# Patient Record
Sex: Female | Born: 1960 | Hispanic: Yes | Marital: Married | Smoking: Never smoker
Health system: Southern US, Community
[De-identification: ages and names within clinical notes are randomized; demographics above are authoritative.]

## PROBLEM LIST (undated history)

## (undated) DIAGNOSIS — D649 Anemia, unspecified: Secondary | ICD-10-CM

## (undated) DIAGNOSIS — G4730 Sleep apnea, unspecified: Secondary | ICD-10-CM

## (undated) DIAGNOSIS — F419 Anxiety disorder, unspecified: Secondary | ICD-10-CM

## (undated) HISTORY — PX: OTHER SURGICAL HISTORY: SHX169

## (undated) HISTORY — DX: Anxiety disorder, unspecified: F41.9

## (undated) HISTORY — DX: Anemia, unspecified: D64.9

## (undated) HISTORY — PX: BREAST REDUCTION SURGERY: SHX8

## (undated) HISTORY — DX: Sleep apnea, unspecified: G47.30

## (undated) HISTORY — PX: BARIATRIC SURGERY: SHX1103

## (undated) HISTORY — PX: COSMETIC SURGERY: SHX468

---

## 1990-12-15 DIAGNOSIS — D509 Iron deficiency anemia, unspecified: Secondary | ICD-10-CM | POA: Insufficient documentation

## 2006-04-29 HISTORY — PX: BARIATRIC SURGERY: SHX1103

## 2010-05-07 LAB — HM DEXA SCAN

## 2014-02-04 LAB — HEPATIC FUNCTION PANEL
ALT: 16 U/L (ref 7–35)
AST: 18 U/L (ref 13–35)
Alkaline Phosphatase: 114 U/L (ref 25–125)
Bilirubin, Total: 0.3 mg/dL

## 2014-02-04 LAB — LIPID PANEL
Cholesterol: 200 mg/dL (ref 0–200)
HDL: 88 mg/dL — AB (ref 35–70)
LDL Cholesterol: 95 mg/dL
TRIGLYCERIDES: 87 mg/dL (ref 40–160)

## 2014-02-04 LAB — BASIC METABOLIC PANEL
BUN: 22 mg/dL — AB (ref 4–21)
Creatinine: 0.8 mg/dL (ref <=1.1)
Glucose: 93 mg/dL
POTASSIUM: 3.8 mmol/L (ref 3.4–5.3)
Sodium: 139 mmol/L (ref 137–147)

## 2014-02-04 LAB — HEMOGLOBIN A1C

## 2014-02-04 LAB — CBC AND DIFFERENTIAL
HCT: 37 % (ref 36–46)
HEMOGLOBIN: 11.8 g/dL — AB (ref 12.0–16.0)
PLATELETS: 215 10*3/uL (ref 150–399)
WBC: 6.9 10*3/mL

## 2015-09-21 ENCOUNTER — Ambulatory Visit (INDEPENDENT_AMBULATORY_CARE_PROVIDER_SITE_OTHER): Payer: 59 | Admitting: Family Medicine

## 2015-09-21 ENCOUNTER — Encounter: Payer: Self-pay | Admitting: Family Medicine

## 2015-09-21 VITALS — BP 111/74 | HR 72 | Ht 65.0 in | Wt 242.0 lb

## 2015-09-21 DIAGNOSIS — F411 Generalized anxiety disorder: Secondary | ICD-10-CM

## 2015-09-21 DIAGNOSIS — M609 Myositis, unspecified: Secondary | ICD-10-CM

## 2015-09-21 DIAGNOSIS — E669 Obesity, unspecified: Secondary | ICD-10-CM

## 2015-09-21 DIAGNOSIS — G43009 Migraine without aura, not intractable, without status migrainosus: Secondary | ICD-10-CM

## 2015-09-21 DIAGNOSIS — F5102 Adjustment insomnia: Secondary | ICD-10-CM

## 2015-09-21 DIAGNOSIS — IMO0001 Reserved for inherently not codable concepts without codable children: Secondary | ICD-10-CM | POA: Insufficient documentation

## 2015-09-21 DIAGNOSIS — M791 Myalgia: Secondary | ICD-10-CM | POA: Diagnosis not present

## 2015-09-21 DIAGNOSIS — Z1239 Encounter for other screening for malignant neoplasm of breast: Secondary | ICD-10-CM

## 2015-09-21 DIAGNOSIS — G43909 Migraine, unspecified, not intractable, without status migrainosus: Secondary | ICD-10-CM | POA: Insufficient documentation

## 2015-09-21 DIAGNOSIS — F419 Anxiety disorder, unspecified: Secondary | ICD-10-CM | POA: Insufficient documentation

## 2015-09-21 MED ORDER — ZOLPIDEM TARTRATE ER 6.25 MG PO TBCR: 6.2500 mg | EXTENDED_RELEASE_TABLET | Freq: Every evening | ORAL | Status: DC | PRN

## 2015-09-21 MED ORDER — NABUMETONE 500 MG PO TABS: 500.0000 mg | ORAL_TABLET | Freq: Once | ORAL | Status: DC

## 2015-09-21 MED ORDER — TOPIRAMATE 25 MG PO CPSP: 25.0000 mg | ORAL_CAPSULE | Freq: Two times a day (BID) | ORAL | Status: DC

## 2015-09-21 MED ORDER — BUSPIRONE HCL 10 MG PO TABS: 10.0000 mg | ORAL_TABLET | Freq: Two times a day (BID) | ORAL | Status: DC

## 2015-09-21 MED ORDER — VENLAFAXINE HCL ER 150 MG PO CP24: 150.0000 mg | ORAL_CAPSULE | Freq: Once | ORAL | Status: DC

## 2015-09-21 MED ORDER — BUTALBITAL-APAP-CAFF-COD 50-325-40-30 MG PO CAPS: 1.0000 | ORAL_CAPSULE | ORAL | Status: DC | PRN

## 2015-09-21 NOTE — Assessment & Plan Note (Signed)
Refill BuSpar and Effexor. Routine counseling done

## 2015-09-21 NOTE — Patient Instructions (Signed)
1) keep migraine/headache journal 2) refill all current meds without change 3) Will await medical records from [place] neurologist and PCP. 4) refer for mammography last one done 2015, always been normal 5) given information about:: cologuard, please call your medical insurance to ask him much it would cost. 6) follow-up one to 2 months and hopefully by then we will have all medical records. We will reassess how her migraines are and review your migraine journal. At that time we will make medication changes as needed.   Migraine Headache A migraine headache is an intense, throbbing pain on one or both sides of your head. A migraine can last for 30 minutes to several hours. CAUSES  The exact cause of a migraine headache is not always known. However, a migraine may be caused when nerves in the brain become irritated and release chemicals that cause inflammation. This causes pain. Certain things may also trigger migraines, such as:  Alcohol.  Smoking.  Stress.  Menstruation.  Aged cheeses.  Foods or drinks that contain nitrates, glutamate, aspartame, or tyramine.  Lack of sleep.  Chocolate.  Caffeine.  Hunger.  Physical exertion.  Fatigue.  Medicines used to treat chest pain (nitroglycerine), birth control pills, estrogen, and some blood pressure medicines. SIGNS AND SYMPTOMS  Pain on one or both sides of your head.  Pulsating or throbbing pain.  Severe pain that prevents daily activities.  Pain that is aggravated by any physical activity.  Nausea, vomiting, or both.  Dizziness.  Pain with exposure to bright lights, loud noises, or activity.  General sensitivity to bright lights, loud noises, or smells. Before you get a migraine, you may get warning signs that a migraine is coming (aura). An aura may include:  Seeing flashing lights.  Seeing bright spots, halos, or zigzag lines.  Having tunnel vision or blurred vision.  Having feelings of numbness or  tingling.  Having trouble talking.  Having muscle weakness. DIAGNOSIS  A migraine headache is often diagnosed based on:  Symptoms.  Physical exam.  A CT scan or MRI of your head. These imaging tests cannot diagnose migraines, but they can help rule out other causes of headaches. TREATMENT Medicines may be given for pain and nausea. Medicines can also be given to help prevent recurrent migraines.  HOME CARE INSTRUCTIONS  Only take over-the-counter or prescription medicines for pain or discomfort as directed by your health care provider. The use of long-term narcotics is not recommended.  Lie down in a dark, quiet room when you have a migraine.  Keep a journal to find out what may trigger your migraine headaches. For example, write down:  What you eat and drink.  How much sleep you get.  Any change to your diet or medicines.  Limit alcohol consumption.  Quit smoking if you smoke.  Get 7-9 hours of sleep, or as recommended by your health care provider.  Limit stress.  Keep lights dim if bright lights bother you and make your migraines worse. SEEK IMMEDIATE MEDICAL CARE IF:   Your migraine becomes severe.  You have a fever.  You have a stiff neck.  You have vision loss.  You have muscular weakness or loss of muscle control.  You start losing your balance or have trouble walking.  You feel faint or pass out.  You have severe symptoms that are different from your first symptoms. MAKE SURE YOU:   Understand these instructions.  Will watch your condition.  Will get help right away if you are not  doing well or get worse.   This information is not intended to replace advice given to you by your health care provider. Make sure you discuss any questions you have with your health care provider.   Document Released: 04/15/2005 Document Revised: 05/06/2014 Document Reviewed: 12/21/2012 Elsevier Interactive Patient Education [employer].

## 2015-09-21 NOTE — Progress Notes (Signed)
Marjory Sneddon, D.O. Family Medicine Physician [facility] Location: Primary Care at [facility]     Subjective:    CC: New pt, here to establish care.   HPI: Margaret Schultz is a pleasant 55 y.o. female who presents to [place] at [facility] today To establish care. She moved here 3 months ago from [place] for her husband's job. She has not established with a PCP in the area. Previously she had been seen by PCP in [place] and a neurologist for her migraines. She has had multiple CAT scans of her head and carotid scans as well. She says they were all normal.  Migraine:  3-4 days/wk with headaches and 1- 2 periods per month of very "strong" migraines. She has no change in her migraines from her baseline. She states that the medicines work ok and she tolerates them well.  Her neurologist in [place] did talk to her about Botox injections but the patient did not want to undergo that procedure.  She tells me she been on Relpax in the past but it makes her throat feels like it's closing so it did not work well.  She is on the Relafen for upper back tightness and muscle spasms which she said is a contributor to her migraines.  The Fioricet is the only thing that will work on some days to break her migraines when they get to the moderate to intense level.  She does not know if there is any triggers to her migraines and states that she will only has one to 2 caffeinated beverages per day.  Obesity: She has had bariatric surgery in the past. She does not follow a healthy diet and admits to eating lots of bread products and white flour products, cheese and milk products. She does not exercise.  She admits that she needs to lose weight and refers to herself as "fat".    Health maintenance:  -Her last mammogram was in 2015 and it was normal, she has never had an abnormal mammogram -Colonoscopy: she has never had one because she feared the procedure. - Last Pap 2015, always  have been normal. Low risk as she's been married for over 30 years.   Patient lives at home with her husband Margaret Schultz) and has one 77 year old son who is in [place] up in [place] in M.D./ PhD program.  Her husband was a physician in [place] where they used to live and currently works in Nurse, children's at [employer]. 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 She has her masters degree in Art therapist.     Past Medical History  Diagnosis Date  . Migraine   . Anxiety     Past Surgical History  Procedure Laterality Date  . Bariatric surgery  2008  . Cosmetic surgery      2010    @[facility] @  History  Drug Use No  ,  History  Alcohol Use No  ,  History  Smoking status  . Never Smoker   Smokeless tobacco  . Never Used  ,  History  Sexual Activity  . Sexual Activity: Yes  . Birth Control/ Protection: None    Patient's Medications  New Prescriptions   No medications on file  Previous Medications   ACETAMINOPHEN-BUTALBITAL 50-325 MG TABS    Take 50-325 mg by mouth daily.   BUSPIRONE (BUSPAR) 10  MG TABLET    Take 10 mg by mouth 2 (two) times daily.   NABUMETONE (RELAFEN) 500 MG TABLET    Take 500 mg by mouth once.   TOPIRAMATE (TOPAMAX) 25 MG CAPSULE    Take 25 mg by mouth 2 (two) times daily.   VENLAFAXINE XR (EFFEXOR-XR) 150 MG 24 HR CAPSULE    Take 150 mg by mouth once.   ZOLPIDEM (AMBIEN CR) 6.25 MG CR TABLET    Take 6.25 mg by mouth at bedtime as needed.  Modified Medications   No medications on file  Discontinued Medications   No medications on file    Review of patient's allergies indicates no known allergies.   Review of Systems: Full 14 point ROS performed via "adult medical history form".  Negative except for Headaches, anxiety, sleep problem which are all chronic.     Objective:   Blood pressure 111/74, pulse 72, height 5\' 5"  (1.651 m), weight 242  lb (109.77 kg), SpO2 98 %.Body mass index is 40.27 kg/(m^2).   General: Well Developed, well nourished, and in no acute distress.  Neuro: Alert and oriented x3, extra-ocular muscles intact, sensation grossly intact.  HEENT: Normocephalic, atraumatic, pupils equal round reactive to light, neck supple, no gross masses Skin: no gross suspicious lesions or rashes  Cardiac: Regular rate and rhythm, no murmurs rubs or gallops.  Respiratory: Essentially clear to auscultation anteriorly and posteriorly bilaterally. Not using accessory muscles, speaking in full sentences.  Abdominal: Soft, not grossly distended Musculoskeletal: Ambulates w/o diff, FROM * 4 ext.  Vasc: less 2 sec cap RF, warm and pink  Psych:  No HI/SI, Pleasant, no signs of anxiety or mood issues. Judgment and insight good.    Impression and Recommendations:    The patient was counselled, risk factors were discussed, anticipatory guidance given. Headache, migraine Keep headache journal for triggers. Refill of Fioricet, Topamax and Relafen. Once we have her medical records from her neurologist and PCP in [place] we will then reassess her in the near future and readjust meds as needed.  Myalgia and myositis      Refill Relafen, will consider changing to Flexeril in future.  Asked patient to walk 10 minutes in the morning 10 minutes in the p.m. to improve blood flow to her muscles. Advised frequent changes in position at work and stretching routine would be very helpful.  Obesity Dietary, exercise and general lifestyle counseling performed.  Patient doesn't like to take medications and is not interested in medical management for her obesity but we did discuss some small lifestyle changes that she could make at this time.  Insomnia due to stress Ambien refill  Did discuss exercise with help decrease stress levels and help with sleep.  Generalized anxiety disorder Refill BuSpar and Effexor. Routine counseling done

## 2015-09-21 NOTE — Assessment & Plan Note (Signed)
Dietary, exercise and general lifestyle counseling performed.  Patient doesn't like to take medications and is not interested in medical management for her obesity but we did discuss some small lifestyle changes that she could make at this time.

## 2015-09-21 NOTE — Assessment & Plan Note (Signed)
Refill Relafen, will consider changing to Flexeril in future.  Asked patient to walk 10 minutes in the morning 10 minutes in the p.m. to improve blood flow to her muscles. Advised frequent changes in position at work and stretching routine would be very helpful.

## 2015-09-21 NOTE — Assessment & Plan Note (Signed)
Keep headache journal for triggers. Refill of Fioricet, Topamax and Relafen. Once we have her medical records from her neurologist and PCP in [place] we will then reassess her in the near future and readjust meds as needed.

## 2015-09-21 NOTE — Assessment & Plan Note (Signed)
Ambien refill  Did discuss exercise with help decrease stress levels and help with sleep.

## 2015-09-27 ENCOUNTER — Other Ambulatory Visit: Payer: Self-pay | Admitting: Family Medicine

## 2015-09-27 DIAGNOSIS — Z1231 Encounter for screening mammogram for malignant neoplasm of breast: Secondary | ICD-10-CM

## 2015-10-16 ENCOUNTER — Ambulatory Visit
Admission: RE | Admit: 2015-10-16 | Discharge: 2015-10-16 | Disposition: A | Discharge status: Home / Self Care | Payer: 59 | Source: Ambulatory Visit | Attending: Family Medicine | Admitting: Family Medicine

## 2015-10-16 DIAGNOSIS — Z1231 Encounter for screening mammogram for malignant neoplasm of breast: Secondary | ICD-10-CM

## 2015-11-14 ENCOUNTER — Ambulatory Visit (INDEPENDENT_AMBULATORY_CARE_PROVIDER_SITE_OTHER): Payer: 59 | Admitting: Family Medicine

## 2015-11-14 ENCOUNTER — Encounter: Payer: Self-pay | Admitting: Family Medicine

## 2015-11-14 VITALS — BP 126/79 | HR 79 | Ht 65.0 in | Wt 242.7 lb

## 2015-11-14 DIAGNOSIS — F419 Anxiety disorder, unspecified: Secondary | ICD-10-CM

## 2015-11-14 DIAGNOSIS — M609 Myositis, unspecified: Secondary | ICD-10-CM

## 2015-11-14 DIAGNOSIS — G43809 Other migraine, not intractable, without status migrainosus: Secondary | ICD-10-CM | POA: Diagnosis not present

## 2015-11-14 DIAGNOSIS — R51 Headache: Secondary | ICD-10-CM | POA: Diagnosis not present

## 2015-11-14 DIAGNOSIS — F430 Acute stress reaction: Secondary | ICD-10-CM

## 2015-11-14 DIAGNOSIS — M791 Myalgia: Secondary | ICD-10-CM

## 2015-11-14 DIAGNOSIS — R519 Headache, unspecified: Secondary | ICD-10-CM | POA: Insufficient documentation

## 2015-11-14 DIAGNOSIS — F5102 Adjustment insomnia: Secondary | ICD-10-CM | POA: Diagnosis not present

## 2015-11-14 DIAGNOSIS — Z114 Encounter for screening for human immunodeficiency virus [HIV]: Secondary | ICD-10-CM

## 2015-11-14 DIAGNOSIS — IMO0001 Reserved for inherently not codable concepts without codable children: Secondary | ICD-10-CM

## 2015-11-14 DIAGNOSIS — Z1159 Encounter for screening for other viral diseases: Secondary | ICD-10-CM

## 2015-11-14 MED ORDER — VENLAFAXINE HCL ER 150 MG PO CP24: ORAL_CAPSULE | ORAL | Status: DC

## 2015-11-14 MED ORDER — TOPIRAMATE 25 MG PO CPSP: ORAL_CAPSULE | ORAL | Status: DC

## 2015-11-14 NOTE — Assessment & Plan Note (Signed)
will consider changing to Flexeril in future.   Asked patient to walk 10 minutes in the morning 10 minutes in the p.m. to improve blood flow to her muscles.   Advised frequent changes in position at work and stretching routine would be very helpful.

## 2015-11-14 NOTE — Assessment & Plan Note (Deleted)
For patient's increased stress level lately we will Doppler her her Wellbutrin to 300 daily still knowing we may go up to 450 if needed. Stress management techniques of meditation and walking daily

## 2015-11-14 NOTE — Patient Instructions (Signed)
Try to walk for stress management and to increase blood flow to your upper back muscles and help relieve the muscle tension that may be starting the headaches.  You can do you can go on YouTube and do search for a meditation for headaches, this is free and a lot of patients find it very helpful.

## 2015-11-14 NOTE — Assessment & Plan Note (Addendum)
We are going to double patient's Topamax dose. See her back in 2-3 weeks if it is not improved significantly then we will consider adding Elavil and/or Neurontin or increasing her Topamax dose wkly to min of 400mg /d divided twice a day.

## 2015-11-14 NOTE — Progress Notes (Signed)
Subjective:    Chief Complaint  Patient presents with  . Migraine    HPI: Margaret Schultz is a 55 y.o. female who presents to [place] at [facility] today bad headaches.   Had seen her just once before for her to become established with our practice.   Chronic headaches for years: I do not have any of her previous headache specialist records for my review yet.   HA on and offmany yrs now- acute on chronic- awoke pt last night, which has done several times in the past.  Same type of symptoms. Nothing new.Clarnce Flock Neuro/headache specialist in [place]-  W/up done then head ct negative, carotids negative, and per patient had essentially negative workup. Symptoms have not changed since that evaluation.   Symptoms start in back of head/ upper neck- muscle tension type- especially associated with stress--> travels into frontal region and around eyes. 10/10 pain at times, meds help "a little", now at 7/10.   Been under more stress lately with work and just regular life stressors.  70% of her life---> pain due to HA.  Patient feeling emotionally exhausted about it.  No aura, no nausea vomiting or diarrhea. No visual changes or hearing  Eye exam- last done two yrs ago. Knows she needs to go again ASAP which we discussed briefly last office visit.    Past Medical History  Diagnosis Date  . Anxiety      Past Surgical History  Procedure Laterality Date  . Bariatric surgery  2008  . Cosmetic surgery      2010     Family History  Problem Relation Age of Onset  . Heart attack Mother   . Diabetes Mother   . Hypertension Mother   . COPD Father      History  Drug Use No  ,  History  Alcohol Use No  ,  History  Smoking status  . Never Smoker   Smokeless tobacco  . Never Used  ,  History  Sexual Activity  . Sexual Activity: Yes  . Birth Control/ Protection: None    Previous Medications   ACETAMINOPHEN-BUTALBITAL 50-325 MG TABS  Take 50-325 mg by mouth  daily.   BUSPIRONE (BUSPAR) 10 MG TABLET  Take 10 mg by mouth 2 (two) times daily.   NABUMETONE (RELAFEN) 500 MG TABLET  Take 500 mg by mouth once.   TOPIRAMATE (TOPAMAX) 25 MG CAPSULE  Take 25 mg by mouth 2 (two) times daily.   VENLAFAXINE XR (EFFEXOR-XR) 150 MG 24 HR CAPSULE  Take 150 mg by mouth once.   ZOLPIDEM (AMBIEN CR) 6.25 MG CR TABLET  Take 6.25 mg by mouth at bedtime as needed.           Outpatient Encounter Prescriptions as of 11/14/2015  Medication Sig  . busPIRone (BUSPAR) 10 MG tablet Take 1 tablet (10 mg total) by mouth 2 (two) times daily.  . butalbital-acetaminophen-caffeine (FIORICET WITH CODEINE) 50-325-40-30 MG capsule Take 1 capsule by mouth every 4 (four) hours as needed for headache.  . nabumetone (RELAFEN) 500 MG tablet Take 1 tablet (500 mg total) by mouth once.  . topiramate (TOPAMAX) 25 MG capsule 2 tabs every 12 hours  . venlafaxine XR (EFFEXOR-XR) 150 MG 24 hr capsule Take two daily  . zolpidem (AMBIEN CR) 6.25 MG CR tablet Take 1 tablet (6.25 mg total) by mouth at bedtime as needed.  . [DISCONTINUED] topiramate (TOPAMAX) 25 MG capsule Take  1 capsule (25 mg total) by mouth 2 (two) times daily.  . [DISCONTINUED] venlafaxine XR (EFFEXOR-XR) 150 MG 24 hr capsule Take 1 capsule (150 mg total) by mouth once.   No facility-administered encounter medications on file as of 11/14/2015.    No Known Allergies    Review of Systems:  ( Completed via adult medical history intake form today ) General:  Denies fever, chills, appetite changes, unexplained weight loss.  Respiratory: Denies SOB, DOE, cough, wheezing.  Cardiovascular: Denies chest pain, palpitations.  Gastrointestinal: Denies nausea, vomiting, diarrhea, abdominal pain.  Genitourinary: Denies dysuria, increased frequency, flank pain. Endocrine: Denies hot or cold intolerance, polyuria, polydipsia. Musculoskeletal: Denies myalgias, back pain, joint swelling, arthralgias, gait  problems.  Skin: Denies pallor, rash, suspicious lesions.  Neurological: Denies dizziness, seizures, syncope, unexplained weakness, lightheadedness, numbness and headaches.  Psychiatric/Behavioral: Denies mood changes, suicidal or homicidal ideations, hallucinations, sleep disturbances.    Objective:    Blood pressure 126/79, pulse 79, height 5\' 5"  (1.651 m), weight 242 lb 11.2 oz (110.088 kg). Body mass index is 40.39 kg/(m^2). General: Well Developed, well nourished, and in no acute distress.  HEENT: Normocephalic, atraumatic, pupils equal round reactive to light Skin: Warm and dry, cap RF less 2 sec Cardiac: Regular rate and rhythm Respiratory: ECTA B/L NeuroM-Sk: Ambulates w/o assistance, moves ext * 4 w/o difficulty, sensation grossly intact, upper trapezius and neck muscles very tight and tender to deep palpation- reproduces some of her pain  Psych: A and O *3, judgement and insight good. Depressed mood    Impression and Recommendations:    The patient was counselled, risk factors were discussed, anticipatory guidance given.   Intractable headache We are going to double patient's Topamax dose. See her back in 2-3 weeks if it is not improved significantly then we will consider adding Elavil and/or Neurontin or increasing her Topamax dose wkly to min of 400mg /d divided twice a day.  Acute reaction to stress For patient's increased stress level lately we will double her Wellbutrin to 300 daily still knowing we may go up to 450 if needed.   Stress management techniques of meditation and walking daily   Myalgia and myositis will consider changing to Flexeril in future.   Asked patient to walk 10 minutes in the morning 10 minutes in the p.m. to improve blood flow to her muscles.   Advised frequent changes in position at work and stretching routine would be very helpful.  Insomnia due to stress Prefer pt use melatonin vs ambien...she will consider changing in  future    Meds ordered this encounter  Medications  . venlafaxine XR (EFFEXOR-XR) 150 MG 24 hr capsule    Sig: Take two daily    Dispense:  90 capsule    Refill:  3  . topiramate (TOPAMAX) 25 MG capsule    Sig: 2 tabs every 12 hours    Dispense:  180 capsule    Refill:  0   Medications Discontinued During This Encounter  Medication Reason  . venlafaxine XR (EFFEXOR-XR) 150 MG 24 hr capsule Reorder  . topiramate (TOPAMAX) 25 MG capsule Reorder    Please see AVS handed out to patient at the end of our visit for further patient instructions/ counseling done pertaining to today's office visit.  Gross side effects, risk and benefits, and alternatives of medications discussed with patient.  Patient is aware that all medications have potential side effects and we are unable to predict every sideeffect or drug-drug interaction that may occur.  Expresses verbal understanding and consents to current therapy plan and treatment regiment.  Note: This document was prepared using Dragon voice recognition software and may include unintentional dictation errors.

## 2015-11-14 NOTE — Assessment & Plan Note (Signed)
Prefer pt use melatonin vs ambien...she will consider changing in future

## 2015-11-14 NOTE — Assessment & Plan Note (Signed)
For patient's increased stress level lately we will double her Wellbutrin to 300 daily still knowing we may go up to 450 if needed.   Stress management techniques of meditation and walking daily

## 2015-12-12 ENCOUNTER — Ambulatory Visit (INDEPENDENT_AMBULATORY_CARE_PROVIDER_SITE_OTHER): Payer: 59 | Admitting: Family Medicine

## 2015-12-12 ENCOUNTER — Encounter: Payer: Self-pay | Admitting: Family Medicine

## 2015-12-12 VITALS — BP 118/72 | HR 69 | Wt 241.2 lb

## 2015-12-12 DIAGNOSIS — F5102 Adjustment insomnia: Secondary | ICD-10-CM

## 2015-12-12 DIAGNOSIS — F430 Acute stress reaction: Secondary | ICD-10-CM

## 2015-12-12 DIAGNOSIS — F419 Anxiety disorder, unspecified: Secondary | ICD-10-CM | POA: Diagnosis not present

## 2015-12-12 DIAGNOSIS — E669 Obesity, unspecified: Secondary | ICD-10-CM

## 2015-12-12 DIAGNOSIS — Z114 Encounter for screening for human immunodeficiency virus [HIV]: Secondary | ICD-10-CM

## 2015-12-12 DIAGNOSIS — R51 Headache: Secondary | ICD-10-CM | POA: Diagnosis not present

## 2015-12-12 DIAGNOSIS — Z1159 Encounter for screening for other viral diseases: Secondary | ICD-10-CM

## 2015-12-12 DIAGNOSIS — R519 Headache, unspecified: Secondary | ICD-10-CM

## 2015-12-12 MED ORDER — TOPIRAMATE 25 MG PO CPSP: ORAL_CAPSULE | ORAL | 0 refills | Status: DC

## 2015-12-12 NOTE — Patient Instructions (Signed)
Stress and Stress Management Stress is a normal reaction to life events. It is what you feel when life demands more than you are used to or more than you can handle. Some stress can be useful. For example, the stress reaction can help you catch the last bus of the day, study for a test, or meet a deadline at work. But stress that occurs too often or for too long can cause problems. It can affect your emotional health and interfere with relationships and normal daily activities. Too much stress can weaken your immune system and increase your risk for physical illness. If you already have a medical problem, stress can make it worse. CAUSES  All sorts of life events may cause stress. An event that causes stress for one person may not be stressful for another person. Major life events commonly cause stress. These may be positive or negative. Examples include losing your job, moving into a new home, getting married, having a baby, or losing a loved one. Less obvious life events may also cause stress, especially if they occur day after day or in combination. Examples include working long hours, driving in traffic, caring for children, being in debt, or being in a difficult relationship. SIGNS AND SYMPTOMS Stress may cause emotional symptoms including, the following:  Anxiety. This is feeling worried, afraid, on edge, overwhelmed, or out of control.  Anger. This is feeling irritated or impatient.  Depression. This is feeling sad, down, helpless, or guilty.  Difficulty focusing, remembering, or making decisions. Stress may cause physical symptoms, including the following:   Aches and pains. These may affect your head, neck, back, stomach, or other areas of your body.  Tight muscles or clenched jaw.  Low energy or trouble sleeping. Stress may cause unhealthy behaviors, including the following:   Eating to feel better (overeating) or skipping meals.  Sleeping too little, too much, or both.  Working  too much or putting off tasks (procrastination).  Smoking, drinking alcohol, or using drugs to feel better. DIAGNOSIS  Stress is diagnosed through an assessment by your health care provider. Your health care provider will ask questions about your symptoms and any stressful life events.Your health care provider will also ask about your medical history and may order blood tests or other tests. Certain medical conditions and medicine can cause physical symptoms similar to stress. Mental illness can cause emotional symptoms and unhealthy behaviors similar to stress. Your health care provider may refer you to a mental health professional for further evaluation.  TREATMENT  Stress management is the recommended treatment for stress.The goals of stress management are reducing stressful life events and coping with stress in healthy ways.  Techniques for reducing stressful life events include the following:  Stress identification. Self-monitor for stress and identify what causes stress for you. These skills may help you to avoid some stressful events.  Time management. Set your priorities, keep a calendar of events, and learn to say "no." These tools can help you avoid making too many commitments. Techniques for coping with stress include the following:  Rethinking the problem. Try to think realistically about stressful events rather than ignoring them or overreacting. Try to find the positives in a stressful situation rather than focusing on the negatives.  Exercise. Physical exercise can release both physical and emotional tension. The key is to find a form of exercise you enjoy and do it regularly.  Relaxation techniques. These relax the body and mind. Examples include yoga, meditation, tai chi, biofeedback, deep  breathing, progressive muscle relaxation, listening to music, being out in nature, journaling, and other hobbies. Again, the key is to find one or more that you enjoy and can do  regularly.  Healthy lifestyle. Eat a balanced diet, get plenty of sleep, and do not smoke. Avoid using alcohol or drugs to relax.  Strong support network. Spend time with family, friends, or other people you enjoy being around.Express your feelings and talk things over with someone you trust. Counseling or talktherapy with a mental health professional may be helpful if you are having difficulty managing stress on your own. Medicine is typically not recommended for the treatment of stress.Talk to your health care provider if you think you need medicine for symptoms of stress. HOME CARE INSTRUCTIONS  Keep all follow-up visits as directed by your health care provider.  Take all medicines as directed by your health care provider. SEEK MEDICAL CARE IF:  Your symptoms get worse or you start having new symptoms.  You feel overwhelmed by your problems and can no longer manage them on your own. SEEK IMMEDIATE MEDICAL CARE IF:  You feel like hurting yourself or someone else.   This information is not intended to replace advice given to you by your health care provider. Make sure you discuss any questions you have with your health care provider.   Document Released: 10/09/2000 Document Revised: 05/06/2014 Document Reviewed: 12/08/2012 Elsevier Interactive Patient Education 2016 [employer] Migraine Headache A migraine headache is an intense, throbbing pain on one or both sides of your head. A migraine can last for 30 minutes to several hours. CAUSES  The exact cause of a migraine headache is not always known. However, a migraine may be caused when nerves in the brain become irritated and release chemicals that cause inflammation. This causes pain. Certain things may also trigger migraines, such as:  Alcohol.  Smoking.  Stress.  Menstruation.  Aged cheeses.  Foods or drinks that contain nitrates, glutamate, aspartame, or tyramine.  Lack of  sleep.  Chocolate.  Caffeine.  Hunger.  Physical exertion.  Fatigue.  Medicines used to treat chest pain (nitroglycerine), birth control pills, estrogen, and some blood pressure medicines. SIGNS AND SYMPTOMS  Pain on one or both sides of your head.  Pulsating or throbbing pain.  Severe pain that prevents daily activities.  Pain that is aggravated by any physical activity.  Nausea, vomiting, or both.  Dizziness.  Pain with exposure to bright lights, loud noises, or activity.  General sensitivity to bright lights, loud noises, or smells. Before you get a migraine, you may get warning signs that a migraine is coming (aura). An aura may include:  Seeing flashing lights.  Seeing bright spots, halos, or zigzag lines.  Having tunnel vision or blurred vision.  Having feelings of numbness or tingling.  Having trouble talking.  Having muscle weakness. DIAGNOSIS  A migraine headache is often diagnosed based on:  Symptoms.  Physical exam.  A CT scan or MRI of your head. These imaging tests cannot diagnose migraines, but they can help rule out other causes of headaches. TREATMENT Medicines may be given for pain and nausea. Medicines can also be given to help prevent recurrent migraines.  HOME CARE INSTRUCTIONS  Only take over-the-counter or prescription medicines for pain or discomfort as directed by your health care provider. The use of long-term narcotics is not recommended.  Lie down in a dark, quiet room when you have a migraine.  Keep a journal to find out what may trigger   your migraine headaches. For example, write down:  What you eat and drink.  How much sleep you get.  Any change to your diet or medicines.  Limit alcohol consumption.  Quit smoking if you smoke.  Get 7-9 hours of sleep, or as recommended by your health care provider.  Limit stress.  Keep lights dim if bright lights bother you and make your migraines worse. SEEK IMMEDIATE MEDICAL  CARE IF:   Your migraine becomes severe.  You have a fever.  You have a stiff neck.  You have vision loss.  You have muscular weakness or loss of muscle control.  You start losing your balance or have trouble walking.  You feel faint or pass out.  You have severe symptoms that are different from your first symptoms. MAKE SURE YOU:   Understand these instructions.  Will watch your condition.  Will get help right away if you are not doing well or get worse.   This information is not intended to replace advice given to you by your health care provider. Make sure you discuss any questions you have with your health care provider.   Document Released: 04/15/2005 Document Revised: 05/06/2014 Document Reviewed: 12/21/2012 Elsevier Interactive Patient Education 2016 Elsevier Inc.  

## 2015-12-12 NOTE — Progress Notes (Signed)
Impression and Recommendations:    1. Intractable headache   2. Insomnia due to stress   3. Obesity   4. Anxiety   5. Acute reaction to stress   6. Need for hepatitis C screening test   7. Screening for HIV (human immunodeficiency virus)     Discussed with patient to continue her medicines. She denies Need for refills today.  I made a change of patient's Topamax in the chart to reflect how patient is taking her medicine.  One in the a.m. and 2 in the PM.  Advised to work on stress management through more meditation and regular exercise daily.  Discussed healthy diet and weight loss.  We'll obtain fasting labs today- she hasn't had any in well over a year per patient.  She will follow-up in September to discuss the labs since she is going to [place] for the next several weeks for work and will not be back until about mid September.  Asked patient to please come in for appointment approximately 10 minutes or more early so that we would have adequate time to see her. CMA was done rooming pt at 8:34am; this was first chance I got to see pt today.    Patient's Medications  New Prescriptions   No medications on file  Previous Medications   BUSPIRONE (BUSPAR) 10 MG TABLET    Take 1 tablet (10 mg total) by mouth 2 (two) times daily.   BUTALBITAL-ACETAMINOPHEN-CAFFEINE (FIORICET WITH CODEINE) 50-325-40-30 MG CAPSULE    Take 1 capsule by mouth every 4 (four) hours as needed for headache.   NABUMETONE (RELAFEN) 500 MG TABLET    Take 1 tablet (500 mg total) by mouth once.   VENLAFAXINE XR (EFFEXOR-XR) 150 MG 24 HR CAPSULE    Take two daily   ZOLPIDEM (AMBIEN CR) 6.25 MG CR TABLET    Take 1 tablet (6.25 mg total) by mouth at bedtime as needed.  Modified Medications   Modified Medication Previous Medication   TOPIRAMATE (TOPAMAX) 25 MG CAPSULE topiramate (TOPAMAX) 25 MG capsule      One tab qAM and 2 tabs qPM    2 tabs every 12 hours  Discontinued Medications   No medications  on file    Return in about 4 weeks (around 01/09/2016) for To discuss lab work  The patient was counseled, risk factors were discussed, anticipatory guidance given.  Gross side effects, risk and benefits, and alternatives of medications discussed with patient.  Patient is aware that all medications have potential side effects and we are unable to predict every side effect or drug-drug interaction that may occur.  Expresses verbal understanding and consents to current therapy plan and treatment regimen.  Please see AVS handed out to patient at the end of our visit for further patient instructions/ counseling done pertaining to today's office visit.    Note: This document was prepared using Dragon voice recognition software and may include unintentional dictation errors.   --------------------------------------------------------------------------------------------------------------------------------------------------------------------------------------------------------------------------------------------    Subjective:    CC:  Chief Complaint  Patient presents with  . Follow-up    headaches, myalgias, stress, insomnia    HPI: Margaret Schultz is a 55 y.o. female who presents to [facility] today for issues as discussed below.   HA's are 60% improved.    Med changes from last OV helped a lot---> can't tolerate 2 topamax in the am's though- too sleepy-  Pt taking two at night --> with  good tolerance and effect.   Sleeping much better- less awakenings and feels more rested next day with use of Ambien nightly.  Patient understands risks but states she cannot live without it at this time..   Is doing deep breathing at night to help her sleep better and dec HA's.  This is the first time in a long time she is not waking up in the morning with a headache.  However, unfortunately she has not had the time to exercise all yet.      Last OV; Intractable headache We  are going to double patient's Topamax dose. See her back in 2-3 weeks if it is not improved significantly then we will consider adding Elavil and/or Neurontin or increasing her Topamax dose wkly to min of 400mg /d divided twice a day.  Acute reaction to stress For patient's increased stress level lately we will double her Wellbutrin to 300 daily still knowing we may go up to 450 if needed.   Stress management techniques of meditation and walking daily     Wt Readings from Last 3 Encounters:  12/12/15 241 lb 3.2 oz (109.4 kg)  11/14/15 242 lb 11.2 oz (110.1 kg)  09/21/15 242 lb (109.8 kg)   BP Readings from Last 3 Encounters:  12/12/15 118/72  11/14/15 126/79  09/21/15 111/74   Pulse Readings from Last 3 Encounters:  12/12/15 69  11/14/15 79  09/21/15 72     Patient Active Problem List   Diagnosis Date Noted  . Intractable headache 11/14/2015  . Acute reaction to stress 11/14/2015  . Headache, migraine 09/21/2015  . Obesity 09/21/2015  . Myalgia and myositis 09/21/2015  . Insomnia due to stress 09/21/2015  . Anxiety 09/21/2015     Past Medical history, Surgical history, Family history, Social history, Allergies and Medications have been entered into the medical record, reviewed and changed as needed.   Allergies:  No Known Allergies  Review of Systems: No fever/ chills, night sweats, no unintended weight loss, No chest pain, or increased shortness of breath. No N/V/D.  Pertinent positives and negatives noted in HPI above    Objective:   Blood pressure 118/72, pulse 69, weight 241 lb 3.2 oz (109.4 kg).  Body mass index is 40.14 kg/m.  General: Well Developed, well nourished, appropriate for stated age.  Neuro: Alert and oriented x3, extra-ocular muscles intact, sensation grossly intact.  HEENT: Normocephalic, atraumatic, neck supple   Skin: Warm and dry, no gross rash. Cardiac: RRR, S1 S2,  no murmurs rubs or gallops.  Respiratory: ECTA B/L, Not using accessory  muscles, speaking in full sentences-unlabored. Vascular:  No gross lower ext edema, cap RF less 2 sec. Psych: No SI/HI, Insight and judgement good

## 2015-12-13 LAB — LIPID PANEL
CHOL/HDL RATIO: 2 ratio (ref <=5.0)
CHOLESTEROL: 192 mg/dL (ref 125–200)
HDL: 94 mg/dL (ref >=46)
LDL Cholesterol: 87 mg/dL (ref <130)
Triglycerides: 55 mg/dL (ref <150)
VLDL: 11 mg/dL (ref <30)

## 2015-12-13 LAB — COMPLETE METABOLIC PANEL WITH GFR
ALK PHOS: 97 U/L (ref 33–130)
ALT: 14 U/L (ref 6–29)
AST: 16 U/L (ref 10–35)
Albumin: 3.9 g/dL (ref 3.6–5.1)
BUN: 26 mg/dL — AB (ref 7–25)
CHLORIDE: 108 mmol/L (ref 98–110)
CO2: 28 mmol/L (ref 20–31)
Calcium: 9.7 mg/dL (ref 8.6–10.4)
Creat: 0.69 mg/dL (ref 0.50–1.05)
GFR, Est African American: >89 mL/min (ref >=60)
GLUCOSE: 92 mg/dL (ref 65–99)
POTASSIUM: 4.7 mmol/L (ref 3.5–5.3)
SODIUM: 141 mmol/L (ref 135–146)
Total Bilirubin: 0.4 mg/dL (ref 0.2–1.2)
Total Protein: 6.6 g/dL (ref 6.1–8.1)

## 2015-12-13 LAB — CBC WITH DIFFERENTIAL/PLATELET
BASOS ABS: 55 {cells}/uL (ref 0–200)
Basophils Relative: 1 %
EOS ABS: 110 {cells}/uL (ref 15–500)
EOS PCT: 2 %
HCT: 34.5 % — ABNORMAL LOW (ref 35.0–45.0)
Hemoglobin: 10.7 g/dL — ABNORMAL LOW (ref 11.7–15.5)
LYMPHS PCT: 32 %
Lymphs Abs: 1760 cells/uL (ref 850–3900)
MCH: 23.9 pg — AB (ref 27.0–33.0)
MCHC: 31 g/dL — AB (ref 32.0–36.0)
MCV: 77 fL — ABNORMAL LOW (ref 80.0–100.0)
MONOS PCT: 7 %
Monocytes Absolute: 385 cells/uL (ref 200–950)
NEUTROS PCT: 58 %
Neutro Abs: 3190 cells/uL (ref 1500–7800)
Platelets: 221 10*3/uL (ref 140–400)
RBC: 4.48 MIL/uL (ref 3.80–5.10)
RDW: 15.5 % — AB (ref 11.0–15.0)
WBC: 5.5 10*3/uL (ref 3.8–10.8)

## 2015-12-13 LAB — HEMOGLOBIN A1C
Hgb A1c MFr Bld: 5.6 % (ref <5.7)
MEAN PLASMA GLUCOSE: 114 mg/dL

## 2015-12-13 LAB — VITAMIN D 25 HYDROXY (VIT D DEFICIENCY, FRACTURES): VIT D 25 HYDROXY: 21 ng/mL — AB (ref 30–100)

## 2015-12-13 LAB — TSH: TSH: 1.64 m[IU]/L

## 2015-12-15 ENCOUNTER — Encounter: Payer: Self-pay | Admitting: Family Medicine

## 2015-12-22 ENCOUNTER — Other Ambulatory Visit: Payer: Self-pay

## 2015-12-22 MED ORDER — BUTALBITAL-APAP-CAFF-COD 50-325-40-30 MG PO CAPS: 1.0000 | ORAL_CAPSULE | ORAL | 0 refills | Status: DC | PRN

## 2015-12-22 MED ORDER — ZOLPIDEM TARTRATE ER 6.25 MG PO TBCR: 6.2500 mg | EXTENDED_RELEASE_TABLET | Freq: Every evening | ORAL | 0 refills | Status: DC | PRN

## 2016-01-11 ENCOUNTER — Other Ambulatory Visit: Payer: Self-pay

## 2016-01-11 ENCOUNTER — Ambulatory Visit: Payer: 59 | Admitting: Family Medicine

## 2016-01-11 MED ORDER — TOPIRAMATE 25 MG PO CPSP: ORAL_CAPSULE | ORAL | 0 refills | Status: DC

## 2016-01-11 NOTE — Telephone Encounter (Signed)
Spoke with pharmacy who states that they did not receive refill for topirimate on 12/12/2015.  In reviewing chart, refill was marked "no print".  Last RX they received was 10/2015.  Verbal RX for topiramate 25mg  #180 one qam and 2 qpm with no refills given to pharmacy.  Charyl Bigger, CMA

## 2016-01-24 ENCOUNTER — Other Ambulatory Visit: Payer: Self-pay

## 2016-01-24 MED ORDER — BUTALBITAL-APAP-CAFF-COD 50-325-40-30 MG PO CAPS: 1.0000 | ORAL_CAPSULE | Freq: Four times a day (QID) | ORAL | 0 refills | Status: DC | PRN

## 2016-01-24 MED ORDER — BUTALBITAL-APAP-CAFF-COD 50-325-40-30 MG PO CAPS
1.0000 | ORAL_CAPSULE | Freq: Four times a day (QID) | ORAL | 0 refills | Status: DC | PRN
Start: 2016-01-24 — End: 2016-01-24

## 2016-01-24 NOTE — Addendum Note (Signed)
Addended by: Fonnie Mu on: 01/24/2016 04:45 PM   Modules accepted: Orders

## 2016-02-22 ENCOUNTER — Other Ambulatory Visit: Payer: Self-pay

## 2016-02-22 MED ORDER — BUTALBITAL-APAP-CAFF-COD 50-325-40-30 MG PO CAPS: 1.0000 | ORAL_CAPSULE | Freq: Four times a day (QID) | ORAL | 0 refills | Status: DC | PRN

## 2016-02-22 NOTE — Telephone Encounter (Signed)
LVM informing pt that RX is ready for pick up at front desk.  Advised pt that she must have OV before any further refills.  Charyl Bigger, CMA

## 2016-03-01 ENCOUNTER — Ambulatory Visit: Payer: 59 | Admitting: Family Medicine

## 2016-03-06 ENCOUNTER — Other Ambulatory Visit: Payer: Self-pay

## 2016-03-11 ENCOUNTER — Other Ambulatory Visit: Payer: Self-pay

## 2016-03-11 MED ORDER — TOPIRAMATE 25 MG PO CPSP: ORAL_CAPSULE | ORAL | 0 refills | Status: DC

## 2016-03-11 NOTE — Telephone Encounter (Signed)
After reviewing pt's medication refill history, refill for topiramate on on 03/06/16 was marked "No print'.  Therefore RX resent to pharmacy with a note that pt must have OV prior to any further refills.  Charyl Bigger, CMA

## 2016-03-27 ENCOUNTER — Telehealth: Payer: Self-pay | Admitting: Family Medicine

## 2016-03-27 MED ORDER — NABUMETONE 500 MG PO TABS: 500.0000 mg | ORAL_TABLET | Freq: Once | ORAL | 0 refills | Status: AC

## 2016-03-27 NOTE — Addendum Note (Signed)
Addended by: Fonnie Mu on: 03/27/2016 10:24 AM   Modules accepted: Orders

## 2016-03-27 NOTE — Telephone Encounter (Signed)
Received refill request from pharmacy as well as pt called requesting refills.  Advised pt that we could refill the nambumetone, but that the refill for zolpidem could only be authorized by Dr. Raliegh Scarlet, who is currently out of the office d/t illness.  Advised pt that when Dr. Raliegh Scarlet returns to the office, we will address this refill at that time.  Pt expressed understanding and is agreeable.  Charyl Bigger, CMA

## 2016-03-27 NOTE — Telephone Encounter (Signed)
Patient has questions about her medications and would like to talk to someone

## 2016-04-01 ENCOUNTER — Other Ambulatory Visit: Payer: Self-pay

## 2016-04-02 NOTE — Telephone Encounter (Signed)
LVM informing pt that she must have OV prior to any refills for zolpidem.  She does have an appointment for 04/18/16 and advised her to please be sure to keep this appointment.  Charyl Bigger, CMA

## 2016-04-09 ENCOUNTER — Ambulatory Visit: Payer: 59 | Admitting: Family Medicine

## 2016-04-18 ENCOUNTER — Ambulatory Visit (INDEPENDENT_AMBULATORY_CARE_PROVIDER_SITE_OTHER): Payer: 59 | Admitting: Family Medicine

## 2016-04-18 ENCOUNTER — Encounter: Payer: Self-pay | Admitting: Family Medicine

## 2016-04-18 VITALS — BP 122/81 | HR 83 | Ht 65.0 in | Wt 247.6 lb

## 2016-04-18 DIAGNOSIS — R51 Headache: Secondary | ICD-10-CM | POA: Diagnosis not present

## 2016-04-18 DIAGNOSIS — F430 Acute stress reaction: Secondary | ICD-10-CM

## 2016-04-18 DIAGNOSIS — F5102 Adjustment insomnia: Secondary | ICD-10-CM | POA: Diagnosis not present

## 2016-04-18 DIAGNOSIS — G43819 Other migraine, intractable, without status migrainosus: Secondary | ICD-10-CM

## 2016-04-18 DIAGNOSIS — R519 Headache, unspecified: Secondary | ICD-10-CM

## 2016-04-18 DIAGNOSIS — Z9884 Bariatric surgery status: Secondary | ICD-10-CM

## 2016-04-18 DIAGNOSIS — D508 Other iron deficiency anemias: Secondary | ICD-10-CM

## 2016-04-18 DIAGNOSIS — F419 Anxiety disorder, unspecified: Secondary | ICD-10-CM

## 2016-04-18 MED ORDER — TOPIRAMATE 25 MG PO CPSP: ORAL_CAPSULE | ORAL | 1 refills | Status: DC

## 2016-04-18 MED ORDER — VENLAFAXINE HCL ER 150 MG PO CP24: ORAL_CAPSULE | ORAL | 1 refills | Status: DC

## 2016-04-18 MED ORDER — BUTALBITAL-APAP-CAFF-COD 50-325-40-30 MG PO CAPS: 1.0000 | ORAL_CAPSULE | Freq: Four times a day (QID) | ORAL | 0 refills | Status: DC | PRN

## 2016-04-18 MED ORDER — BUSPIRONE HCL 15 MG PO TABS: 15.0000 mg | ORAL_TABLET | Freq: Three times a day (TID) | ORAL | 2 refills | Status: DC | PRN

## 2016-04-18 NOTE — Patient Instructions (Addendum)
If you have insomnia or difficulty sleeping, this information is for you:  - Avoid caffeinated beverages after lunch,  no alcoholic beverages,  no eating within 2-3 hours of lying down,  avoid exposure to blue light before bed,  avoid daytime naps, and  needs to maintain a regular sleep schedule- go to sleep and wake up around the same time every night.   - Resolve concerns or worries before entering bedroom:  Discussed relaxation techniques with patient and to keep a journal to write down fears\ worries.  I suggested seeing a counselor for CBT.   - Recommend patient meditate or do deep breathing exercises to help relax.   Incorporate the use of white noise machines or listen to "sleep meditation music", or recordings of guided meditations for sleep from YouTube which are free, such as  "guided meditation for detachment from over thinking"  by Mayford Knife.      Please realize, EXERCISE IS MEDICINE!  -  American Heart Association [facility]) guidelines for exercise : If you are in good health, without any medical conditions, you should engage in 150 minutes of moderate intensity aerobic activity per week.  This means you should be huffing and puffing throughout your workout.   Engaging in regular exercise will improve brain function and memory, as well as improve mood, boost immune system and help with weight management.  As well as the other, more well-known effects of exercise such as decreasing blood sugar levels, decreasing blood pressure,  and decreasing bad cholesterol levels/ increasing good cholesterol levels.     -  The AHA strongly endorses consumption of a diet that contains a variety of foods from all the food categories with an emphasis on fruits and vegetables; fat-free and low-fat dairy products; cereal and grain products; legumes and nuts; and fish, poultry, and/or extra lean meats.    Excessive food intake, especially of foods high in saturated and trans fats, sugar, and salt, should be  avoided.    Adequate water intake of roughly 1/2 of your weight in pounds, should equal the ounces of water per day you should drink.  So for instance, if you're 200 pounds, that would be 100 ounces of water per day.    120 ounces per day if not working out, or doing yoga or sweating

## 2016-04-18 NOTE — Progress Notes (Signed)
Impression and Recommendations:    1. Intractable headache, unspecified chronicity pattern, unspecified headache type   2. Other migraine without status migrainosus, intractable   3. Acute reaction to stress   4. Insomnia due to stress   5. Other iron deficiency anemia   6. S/P gastric bypass- 2007- in [place]   7. Anxiety   8. Obesity, Class III, BMI 40-49.9 (morbid obesity) (HCC)     Acute reaction to stress Increase dose of BuSpar from 10 mg  twice a day to 15 mg 3 times a day when necessary  Continue current dose of Effexor.  Encouraged counseling although patient declines  Recommend she exercise  To a goal of 150 minutes of moderate intensity aerobic activity weekly.  Anxiety Stress management coping mechanisms discussed with patient.    I recommend counseling.  Patient says she will contact our office if she would like a referral to a counselor in the future.  Headache, migraine After doubling patient's Topamax dose last office visit, headaches seem to have worsen.    We discussed possibility of adding Elavil and/or Neurontin or,  increasing her Topamax dose to a minimum of 400 mg per day divided into twice daily dosing etc  Patient desires to be referred to specialist for further evaluation and treatment.  She understands she will get her any barbiturate medicines or pain medicines from them in the future, as well as any medicine to treat her migraines.    Insomnia due to stress Discussed with patient sleep hygiene.    Handouts were provided.  Patient declines trazodone or any other sleep agent.  Will trial melatonin OTC of 3-9 mg nightly.  If this does not work we can consider trazodone or other agent.  Obesity, Class III, BMI 40-49.9 (morbid obesity) (HCC) Dietary, exercise and general lifestyle counseling performed.    Patient doesn't like to take medications and is not interested in medical management for her obesity but we did discuss some small  lifestyle changes that she could make at this time.  Since patient is status post gastric bypass, I recommend she go to a nutrition counselor at this time.  She declined.   Pt was in the office today for 40+ minutes, with over 50% time spent in face to face counseling of various medical concerns and in coordination of care Orders Placed This Encounter  Procedures  . Ambulatory referral to Neurology     New Prescriptions   No medications on file    Modified Medications   Modified Medication Previous Medication   BUSPIRONE (BUSPAR) 15 MG TABLET busPIRone (BUSPAR) 10 MG tablet      Take 1 tablet (15 mg total) by mouth 3 (three) times daily as needed.    Take 1 tablet (10 mg total) by mouth 2 (two) times daily.   BUTALBITAL-ACETAMINOPHEN-CAFFEINE (FIORICET WITH CODEINE) 50-325-40-30 MG CAPSULE butalbital-acetaminophen-caffeine (FIORICET WITH CODEINE) 50-325-40-30 MG capsule      Take 1 capsule by mouth every 6 (six) hours as needed for migraine.    Take 1 capsule by mouth every 6 (six) hours as needed for migraine.   TOPIRAMATE (TOPAMAX) 25 MG CAPSULE topiramate (TOPAMAX) 25 MG capsule      One tab qAM and 2 tabs qPM    One tab qAM and 2 tabs qPM   VENLAFAXINE XR (EFFEXOR-XR) 150 MG 24 HR CAPSULE venlafaxine XR (EFFEXOR-XR) 150 MG 24 hr capsule      Take two daily    Take two  daily    Discontinued Medications   No medications on file    The patient was counseled, risk factors were discussed, anticipatory guidance given.  Gross side effects, risk and benefits, and alternatives of medications and treatment plan in general discussed with patient.  Patient is aware that all medications have potential side effects and we are unable to predict every side effect or drug-drug interaction that may occur.   Patient will call with any questions prior to using medication if they have concerns.  Expresses verbal understanding and consents to current therapy and treatment regimen.  No barriers to  understanding were identified.  Red flag symptoms and signs discussed in detail.  Patient expressed understanding regarding what to do in case of emergency\urgent symptoms  Return in about 4 months (around 08/17/2016) for Follow-up of current medical issues; wt loss, water intake, GAD, insomnia, stress mgt.  Please see AVS handed out to patient at the end of our visit for further patient instructions/ counseling done pertaining to today's office visit.    Note: This document was prepared using Dragon voice recognition software and may include unintentional dictation errors.   --------------------------------------------------------------------------------------------------------------------------------------------------------------------------------------------------------------------------------------------    Subjective:    CC:  Chief Complaint  Patient presents with  . Follow-up    HPI: Margaret Schultz is a 55 y.o. female who presents to [place] at [facility] today for issues as discussed below.   Yoga with cats- 1 d/week- so pt is not needing ambien- used unisom OTC.  Anxiety/ stress:   effexor - unable to ween herself down to 1 tab daily. Felt too bad and stressed at work.   USing buspar 20mg  in am and 10mg  qhs. Would like to see about possibly taking it more frequently\or going up on dose.  Chronic migraine HAs- have been bad lately.   Drinking only 32 ozs per day.    Topamax - taking them daily.  Patient desiring refill of Fioricet and I explained I don't treat chronic pain.  Patient needed a neurologist\headache specialist in the past when she lived in [place], after discussion about the pros and cons of specialty referral, we decided to refer her.   Wt Readings from Last 3 Encounters:  04/18/16 247 lb 9.6 oz (112.3 kg)  12/12/15 241 lb 3.2 oz (109.4 kg)  11/14/15 242 lb 11.2 oz (110.1 kg)   BP Readings from Last 3 Encounters:  04/18/16 122/81  12/12/15  118/72  11/14/15 126/79   Pulse Readings from Last 3 Encounters:  04/18/16 83  12/12/15 69  11/14/15 79   BMI Readings from Last 3 Encounters:  04/18/16 41.20 kg/m  12/12/15 40.14 kg/m  11/14/15 40.39 kg/m     Patient Care Team    Relationship Specialty Notifications Start End  Mellody Dance, DO PCP - General Family Medicine  09/21/15     Patient Active Problem List   Diagnosis Date Noted  . S/P gastric bypass- 2007- in [place] 04/18/2016  . Intractable headache 11/14/2015  . Acute reaction to stress 11/14/2015  . Headache, migraine 09/21/2015  . Obesity, Class III, BMI 40-49.9 (morbid obesity) ([place]) 09/21/2015  . Myalgia and myositis 09/21/2015  . Insomnia due to stress 09/21/2015  . Anxiety 09/21/2015  . Anemia, iron deficiency 12/15/1990    Past Medical history, Surgical history, Family history, Social history, Allergies and Medications have been entered into the medical record, reviewed and changed as needed.   Allergies:  No Known Allergies  Review of Systems  Constitutional: Negative  for chills and fever.  HENT: Negative for congestion and sinus pain.   Eyes: Negative for blurred vision and double vision.  Respiratory: Negative for shortness of breath and wheezing.   Cardiovascular: Negative for chest pain, palpitations and orthopnea.  Gastrointestinal: Negative for constipation, diarrhea, heartburn, nausea and vomiting.  Genitourinary: Negative for frequency and hematuria.  Musculoskeletal: Positive for joint pain. Negative for falls.       Chronic jt pains  Skin: Negative for rash.  Neurological: Negative for dizziness, sensory change and focal weakness.  Endo/Heme/Allergies: Negative for polydipsia.  Psychiatric/Behavioral: Negative for depression and suicidal ideas. The patient is nervous/anxious and has insomnia.      Objective:   Blood pressure 122/81, pulse 83, height 5\' 5"  (1.651 m), weight 247 lb 9.6 oz (112.3 kg). Body mass index is 41.2  kg/m. General: Well Developed, well nourished, appropriate for stated age.  Neuro: Alert and oriented x3, extra-ocular muscles intact, sensation grossly intact.  HEENT: Normocephalic, atraumatic, neck supple, no carotid bruits appreciated  Skin: no gross rash. Cardiac: RRR, S1 S2 Respiratory: ECTA B/L, Not using accessory muscles, speaking in full sentences-unlabored. Vascular:  Ext warm, dry, pink; cap RF less 2 sec. Psych: No HI/SI, judgement and insight good, Euthymic mood- does not appear anxious today.. Full Affect.

## 2016-05-19 NOTE — Assessment & Plan Note (Signed)
After doubling patient's Topamax dose last office visit, headaches seem to have worsen.    We discussed possibility of adding Elavil and/or Neurontin or,  increasing her Topamax dose to a minimum of 400 mg per day divided into twice daily dosing etc  Patient desires to be referred to specialist for further evaluation and treatment.  She understands she will get her any barbiturate medicines or pain medicines from them in the future, as well as any medicine to treat her migraines.

## 2016-05-19 NOTE — Assessment & Plan Note (Addendum)
Dietary, exercise and general lifestyle counseling performed.    Patient doesn't like to take medications and is not interested in medical management for her obesity but we did discuss some small lifestyle changes that she could make at this time.  Since patient is status post gastric bypass, I recommend she go to a nutrition counselor at this time.  She declined.

## 2016-05-19 NOTE — Assessment & Plan Note (Signed)
Stress management coping mechanisms discussed with patient.    I recommend counseling.  Patient says she will contact our office if she would like a referral to a counselor in the future.

## 2016-05-19 NOTE — Assessment & Plan Note (Signed)
Increase dose of BuSpar from 10 mg  twice a day to 15 mg 3 times a day when necessary  Continue current dose of Effexor.  Encouraged counseling although patient declines  Recommend she exercise  To a goal of 150 minutes of moderate intensity aerobic activity weekly.

## 2016-05-19 NOTE — Assessment & Plan Note (Signed)
Discussed with patient sleep hygiene.    Handouts were provided.  Patient declines trazodone or any other sleep agent.  Will trial melatonin OTC of 3-9 mg nightly.  If this does not work we can consider trazodone or other agent.

## 2016-05-20 ENCOUNTER — Other Ambulatory Visit: Payer: Self-pay

## 2016-06-17 ENCOUNTER — Other Ambulatory Visit: Payer: Self-pay

## 2016-06-17 MED ORDER — TOPIRAMATE 25 MG PO CPSP: ORAL_CAPSULE | ORAL | 1 refills | Status: DC

## 2016-06-17 MED ORDER — NABUMETONE 500 MG PO TABS: 500.0000 mg | ORAL_TABLET | Freq: Once | ORAL | 0 refills | Status: AC

## 2016-08-26 ENCOUNTER — Other Ambulatory Visit: Payer: Self-pay

## 2016-08-26 MED ORDER — VENLAFAXINE HCL ER 150 MG PO CP24: ORAL_CAPSULE | ORAL | 0 refills | Status: DC

## 2016-08-26 NOTE — Telephone Encounter (Signed)
Received faxed refill request for nabumetone 500mg  one tablet po daily PRN.  We have not prescribed these medications for the patient previously.  Please review and refill if appropriate.  Charyl Bigger, CMA

## 2016-08-27 NOTE — Telephone Encounter (Signed)
I believe pt sees a specialist for her HA's now- she will need to get pain meds for her migraines/ HA's from them.  No RF for the Nabumetone

## 2016-08-28 ENCOUNTER — Telehealth: Payer: Self-pay

## 2016-08-28 NOTE — Telephone Encounter (Signed)
Received refill request for nabumetaone.  Spoke with pt who states that she does take this medication for migraines.  Advised pt that since she sees a headache specialist, this medication should be prescribed by their office.  Pt expressed understanding.  Notification faxed back to pharmacy.  Charyl Bigger, CMA

## 2016-09-16 ENCOUNTER — Encounter: Payer: Self-pay | Admitting: Family Medicine

## 2016-09-16 ENCOUNTER — Ambulatory Visit (INDEPENDENT_AMBULATORY_CARE_PROVIDER_SITE_OTHER): Payer: 59 | Admitting: Family Medicine

## 2016-09-16 VITALS — BP 120/76 | HR 90 | Ht 65.0 in | Wt 246.2 lb

## 2016-09-16 DIAGNOSIS — F5102 Adjustment insomnia: Secondary | ICD-10-CM | POA: Diagnosis not present

## 2016-09-16 DIAGNOSIS — G43809 Other migraine, not intractable, without status migrainosus: Secondary | ICD-10-CM

## 2016-09-16 DIAGNOSIS — F419 Anxiety disorder, unspecified: Secondary | ICD-10-CM | POA: Diagnosis not present

## 2016-09-16 DIAGNOSIS — Z9884 Bariatric surgery status: Secondary | ICD-10-CM

## 2016-09-16 DIAGNOSIS — F430 Acute stress reaction: Secondary | ICD-10-CM

## 2016-09-16 MED ORDER — MELATONIN 3 MG SL SUBL: 3.0000 | SUBLINGUAL_TABLET | Freq: Every evening | SUBLINGUAL | 0 refills | Status: DC | PRN

## 2016-09-16 MED ORDER — VENLAFAXINE HCL ER 150 MG PO CP24: ORAL_CAPSULE | ORAL | 0 refills | Status: DC

## 2016-09-16 MED ORDER — BUSPIRONE HCL 15 MG PO TABS: 15.0000 mg | ORAL_TABLET | Freq: Three times a day (TID) | ORAL | 2 refills | Status: DC | PRN

## 2016-09-16 NOTE — Assessment & Plan Note (Signed)
Continue current dose of Effexor.    Reminded patient she may increase BuSpar to from twice a day to 3 times a day.  Stress measurement techniques reviewed with patient.

## 2016-09-16 NOTE — Assessment & Plan Note (Signed)
Cont efexor  May inc buspar to TID from BID that she is using now  st

## 2016-09-16 NOTE — Assessment & Plan Note (Signed)
She is wondering if they can "redo" her sx at all.

## 2016-09-16 NOTE — Assessment & Plan Note (Signed)
Management per headache specialist.  I told patient I would not refill her nabumetone

## 2016-09-16 NOTE — Assessment & Plan Note (Signed)
Referred to bariatric medicine per patient's request.  Nutrition and exercise counseling done.

## 2016-09-16 NOTE — Assessment & Plan Note (Signed)
Patient declines trazodone again.  Encouraged her to use melatonin up to 9 mg nightly as needed.  She will use her BuSpar as well  Bedtime.

## 2016-09-16 NOTE — Progress Notes (Signed)
Impression and Recommendations:    1. Acute reaction to stress   2. Anxiety   3. Insomnia due to stress   4. Obesity, Class III, BMI 40-49.9 (morbid obesity) ([place])   5. S/P gastric bypass- 2007- in [place]   6. Other migraine without status migrainosus, not intractable      Acute reaction to stress Cont efexor  May inc buspar to TID from BID that she is using now  st  Anxiety Continue current dose of Effexor.    Reminded patient she may increase BuSpar to from twice a day to 3 times a day.  Stress measurement techniques reviewed with patient.      Insomnia due to stress Patient declines trazodone again.  Encouraged her to use melatonin up to 9 mg nightly as needed.  She will use her BuSpar as well  Bedtime.  Obesity, Class III, BMI 40-49.9 (morbid obesity) ([name]) Referred to bariatric medicine per patient's request.  Nutrition and exercise counseling done.   S/P gastric bypass- 2007- in [place] She is wondering if they can "redo" her sx at all.  Headache, migraine Management per headache specialist.  I told patient I would not refill her nabumetone   The patient was counseled, risk factors were discussed, anticipatory guidance given.    Meds ordered this encounter  Medications  . QUDEXY XR 100 MG CS24    Sig: Take 1 capsule by mouth daily.    Refill:  1  . baclofen (LIORESAL) 10 MG tablet    Sig: Take 1 tablet by mouth daily as needed.    Refill:  0  . nabumetone (RELAFEN) 500 MG tablet    Sig: Take 1 tablet by mouth daily as needed.    Refill:  0  . busPIRone (BUSPAR) 15 MG tablet    Sig: Take 1 tablet (15 mg total) by mouth 3 (three) times daily as needed.    Dispense:  90 tablet    Refill:  2  . venlafaxine XR (EFFEXOR-XR) 150 MG 24 hr capsule    Sig: Take two tablets once daily.  Patient must have office visit prior to any further refills.    Dispense:  60 capsule    Refill:  0  . Melatonin 3 MG SUBL    Sig: Place 3 tablets under  the tongue at bedtime as needed. 1-3 tabs    Dispense:  30 tablet    Refill:  0     Discontinued Medications   BUTALBITAL-ACETAMINOPHEN-CAFFEINE (FIORICET WITH CODEINE) 50-325-40-30 MG CAPSULE    Take 1 capsule by mouth every 6 (six) hours as needed for migraine.   TOPIRAMATE (TOPAMAX) 25 MG CAPSULE    One tab qAM and 2 tabs qPM   ZOLPIDEM (AMBIEN CR) 6.25 MG CR TABLET    Take 1 tablet (6.25 mg total) by mouth at bedtime as needed.      Orders Placed This Encounter  Procedures  . Amb Ref to Medical Weight Management     Gross side effects, risk and benefits, and alternatives of medications and treatment plan in general discussed with patient.  Patient is aware that all medications have potential side effects and we are unable to predict every side effect or drug-drug interaction that may occur.   Patient will call with any questions prior to using medication if they have concerns.  Expresses verbal understanding and consents to current therapy and treatment regimen.  No barriers to understanding were identified.  Red flag symptoms and  signs discussed in detail.  Patient expressed understanding regarding what to do in case of emergency\urgent symptoms  Please see AVS handed out to patient at the end of our visit for further patient instructions/ counseling done pertaining to today's office visit.   Return in about 3 months (around 12/17/2016) for compete physical, come fasting.     Note: This document was prepared using Dragon voice recognition software and may include unintentional dictation errors.  Ammie Warrick 9:01 AM --------------------------------------------------------------------------------------------------------------------------------------------------------------------------------------------------------------------------------------------    Subjective:    CC:  Chief Complaint  Patient presents with  . Weight Loss  . Anxiety  . Insomnia    HPI: Margaret Schultz is a 56 y.o. female who presents to [place] at [facility] today for issues as discussed below.  -- GAD:  effexor 300 working well; only uses buspar  BID - everyday.  Helps her sleep at night.  730am and 930pm or so.   -- insomnia:  Tried to stop Margaret Schultz last time.  Trying unisom; didn't try melatonin.  Poor sleep habits;  Gets up multiple times per night.  Did have a sleep study prior to gastric bypass   -- gastric bypass 2006 - it was in [place].  She has not been to bariatric med doc many yrs and was looking for consultation on that and see if they can help adjust things surgically / advice nutrition.    Drinking more water-  36oz/day only per day.    Started walking 1 wk ago--> goal is 10k, has been doing about 6k.    Still down more than 130lbs from prior to sx. Snores a lot.  But doesn't think she can wear cpap. Has sleep number bad and can incline head of bed.  --- chronic HAs:  Seeing specialist.  Changed her on a long acting topamax and baclofen.   Doing better- less HA's-- triggers are etoh.    Has appt with HA doc soon--> less freq but more intense when does happen.     Wt Readings from Last 3 Encounters:  09/16/16 246 lb 3.2 oz (111.7 kg)  04/18/16 247 lb 9.6 oz (112.3 kg)  12/12/15 241 lb 3.2 oz (109.4 kg)   BP Readings from Last 3 Encounters:  09/16/16 120/76  04/18/16 122/81  12/12/15 118/72   Pulse Readings from Last 3 Encounters:  09/16/16 90  04/18/16 83  12/12/15 69   BMI Readings from Last 3 Encounters:  09/16/16 40.97 kg/m  04/18/16 41.20 kg/m  12/12/15 40.14 kg/m     Patient Care Team    Relationship Specialty Notifications Start End  Mellody Dance, DO PCP - General Family Medicine  09/21/15      Patient Active Problem List   Diagnosis Date Noted  . S/P gastric bypass- 2007- in [place] 04/18/2016  . Intractable headache 11/14/2015  . Acute reaction to stress 11/14/2015  . Headache, migraine 09/21/2015  . Obesity, Class III,  BMI 40-49.9 (morbid obesity) ([place]) 09/21/2015  . Myalgia and myositis 09/21/2015  . Insomnia due to stress 09/21/2015  . Anxiety 09/21/2015  . Anemia, iron deficiency 12/15/1990    Past Medical history, Surgical history, Family history, Social history, Allergies and Medications have been entered into the medical record, reviewed and changed as needed.    Current Meds  Medication Sig  . baclofen (LIORESAL) 10 MG tablet Take 1 tablet by mouth daily as needed.  . busPIRone (BUSPAR) 15 MG tablet Take 1 tablet (15 mg total) by mouth 3 (three)  times daily as needed.  . nabumetone (RELAFEN) 500 MG tablet Take 1 tablet by mouth daily as needed.  . QUDEXY XR 100 MG CS24 Take 1 capsule by mouth daily.  Marland Kitchen venlafaxine XR (EFFEXOR-XR) 150 MG 24 hr capsule Take two tablets once daily.  Patient must have office visit prior to any further refills.  . [DISCONTINUED] busPIRone (BUSPAR) 15 MG tablet Take 1 tablet (15 mg total) by mouth 3 (three) times daily as needed.  . [DISCONTINUED] venlafaxine XR (EFFEXOR-XR) 150 MG 24 hr capsule Take two tablets once daily.  Patient must have office visit prior to any further refills.    Allergies:  No Known Allergies   Review of Systems: General:   Denies fever, chills, unexplained weight loss.  Optho/Auditory:   Denies visual changes, blurred vision/LOV Respiratory:   Denies wheeze, DOE more than baseline levels.  Cardiovascular:   Denies chest pain, palpitations, new onset peripheral edema  Gastrointestinal:   Denies nausea, vomiting, diarrhea, abd pain.  Genitourinary: Denies dysuria, freq/ urgency, flank pain or discharge from genitals.  Endocrine:     Denies hot or cold intolerance, polyuria, polydipsia. Musculoskeletal:   Denies unexplained myalgias, joint swelling, unexplained arthralgias, gait problems.  Skin:  Denies new onset rash, suspicious lesions Neurological:     Denies dizziness, unexplained weakness, numbness  Psychiatric/Behavioral:   Denies  mood changes, suicidal or homicidal ideations, hallucinations    Objective:   Blood pressure 120/76, pulse 90, height 5\' 5"  (1.651 m), weight 246 lb 3.2 oz (111.7 kg). Body mass index is 40.97 kg/m. General:  Well Developed, well nourished, appropriate for stated age.  Neuro:  Alert and oriented,  extra-ocular muscles intact  HEENT:  Normocephalic, atraumatic, neck supple, no carotid bruits appreciated  Skin:  no gross rash, warm, pink. Cardiac:  RRR, S1 S2 Respiratory:  ECTA B/L and A/P, Not using accessory muscles, speaking in full sentences- unlabored. Vascular:  Ext warm, no cyanosis apprec.; cap RF less 2 sec. Psych:  No HI/SI, judgement and insight good, Euthymic mood. Full Affect.

## 2016-09-16 NOTE — Patient Instructions (Signed)
Guidelines for Losing Weight   We want weight loss that will last so you should lose 1-2 pounds a week.  THAT IS IT! Please pick THREE things a month to change. Once it is a habit check off the item. Then pick another three items off the list to become habits.  If you are already doing a habit on the list GREAT!  Cross that item off!  Don't drink your calories. Ie, alcohol, soda, fruit juice, and sweet tea.   Drink more water. Drink a glass when you feel hungry or before each meal.   Eat breakfast - Complex carb and protein (likeDannon light and fit yogurt, oatmeal, fruit, eggs, turkey bacon).  Measure your cereal.  Eat no more than one cup a day. (ie Kashi)  Eat an apple a day.  Add a vegetable a day.  Try a new vegetable a month.  Use Pam! Stop using oil or butter to cook.  Don't finish your plate or use smaller plates.  Share your dessert.  Eat sugar free Jello for dessert or frozen grapes.  Don't eat 2-3 hours before bed.  Switch to whole wheat bread, pasta, and brown rice.  Make healthier choices when you eat out. No fries!  Pick baked chicken, NOT fried.  Don't forget to SLOW DOWN when you eat. It is not going anywhere.   Take the stairs.  Park far away in the parking lot  Lift soup cans (or weights) for 10 minutes while watching TV.  Walk at work for 10 minutes during break.  Walk outside 1 time a week with your friend, kids, dog, or significant other.  Start a walking group at church.  Walk the mall as much as you can tolerate.   Keep a food diary.  Weigh yourself daily.  Walk for 15 minutes 3 days per week.  Cook at home more often and eat out less. If life happens and you go back to old habits, it is okay.  Just start over. You can do it!  If you experience chest pain, get short of breath, or tired during the exercise, please stop immediately and inform your doctor.    Before you even begin to attack a weight-loss plan, it pays to remember  this: You are not fat. You have fat. Losing weight isn't about blame or shame; it's simply another achievement to accomplish. Dieting is like any other skill-you have to buckle down and work at it. As long as you act in a smart, reasonable way, you'll ultimately get where you want to be. Here are some weight loss pearls for you.   1. It's Not a Diet. It's a Lifestyle Thinking of a diet as something you're on and suffering through only for the short term doesn't work. To shed weight and keep it off, you need to make permanent changes to the way you eat. It's OK to indulge occasionally, of course, but if you cut calories temporarily and then revert to your old way of eating, you'll gain back the weight quicker than you can say yo-yo. Use it to lose it. Research shows that one of the best predictors of long-term weight loss is how many pounds you drop in the first month. For that reason, nutritionists often suggest being stricter for the first two weeks of your new eating strategy to build momentum. Cut out added sugar and alcohol and avoid unrefined carbs. After that, figure out how you can reincorporate them in a way that's healthy and   maintainable.  2. There's a Right Way to Exercise Working out burns calories and fat and boosts your metabolism by building muscle. But those trying to lose weight are notorious for overestimating the number of calories they burn and underestimating the amount they take in. Unfortunately, your system is biologically programmed to hold on to extra pounds and that means when you start exercising, your body senses the deficit and ramps up its hunger signals. If you're not diligent, you'll eat everything you burn and then some. Use it, to lose it. Cardio gets all the exercise glory, but strength and interval training are the real heroes. They help you build lean muscle, which in turn increases your metabolism and calorie-burning ability 3. Don't Overreact to Mild Hunger Some  people have a hard time losing weight because of hunger anxiety. To them, being hungry is bad-something to be avoided at all costs-so they carry snacks with them and eat when they don't need to. Others eat because they're stressed out or bored. While you never want to get to the point of being ravenous (that's when bingeing is likely to happen), a hunger pang, a craving, or the fact that it's 3:00 p.m. should not send you racing for the vending machine or obsessing about the energy bar in your purse. Ideally, you should put off eating until your stomach is growling and it's difficult to concentrate.  Use it to lose it. When you feel the urge to eat, use the HALT method. Ask yourself, Am I really hungry? Or am I angry or anxious, lonely or bored, or tired? If you're still not certain, try the apple test. If you're truly hungry, an apple should seem delicious; if it doesn't, something else is going on. Or you can try drinking water and making yourself busy, if you are still hungry try a healthy snack.  4. Not All Calories Are Created Equal The mechanics of weight loss are pretty simple: Take in fewer calories than you use for energy. But the kind of food you eat makes all the difference. Processed food that's high in saturated fat and refined starch or sugar can cause inflammation that disrupts the hormone signals that tell your brain you're full. The result: You eat a lot more.  Use it to lose it. Clean up your diet. Swap in whole, unprocessed foods, including vegetables, lean protein, and healthy fats that will fill you up and give you the biggest nutritional bang for your calorie buck. In a few weeks, as your brain starts receiving regular hunger and fullness signals once again, you'll notice that you feel less hungry overall and naturally start cutting back on the amount you eat.  5. Protein, Produce, and Plant-Based Fats Are Your Weight-Loss Trinity Here's why eating the three Ps regularly will help you  drop pounds. Protein fills you up. You need it to build lean muscle, which keeps your metabolism humming so that you can torch more fat. People in a weight-loss program who ate double the recommended daily allowance for protein (about 110 grams for a 150-pound woman) lost 70 percent of their weight from fat, while people who ate the RDA lost only about 40 percent, one study found. Produce is packed with filling fiber. "It's very difficult to consume too many calories if you're eating a lot of vegetables. Example: Three cups of broccoli is a lot of food, yet only 93 calories. (Fruit is another story. It can be easy to overeat and can contain a lot of calories   from sugar, so be sure to monitor your intake.) Plant-based fats like olive oil and those in avocados and nuts are healthy and extra satiating.  Use it to lose it. Aim to incorporate each of the three Ps into every meal and snack. People who eat protein throughout the day are able to keep weight off, according to a study in the American Journal of Clinical Nutrition. In addition to meat, poultry and seafood, good sources are beans, lentils, eggs, tofu, and yogurt. As for fat, keep portion sizes in check by measuring out salad dressing, oil, and nut butters (shoot for one to two tablespoons). Finally, eat veggies or a little fruit at every meal. People who did that consumed 308 fewer calories but didn't feel any hungrier than when they didn't eat more produce.  7. How You Eat Is As Important As What You Eat In order for your brain to register that you're full, you need to focus on what you're eating. Sit down whenever you eat, preferably at a table. Turn off the TV or computer, put down your phone, and look at your food. Smell it. Chew slowly, and don't put another bite on your fork until you swallow. When women ate lunch this attentively, they consumed 30 percent less when snacking later than those who listened to an audiobook at lunchtime, according to a  study in the British Journal of Nutrition. 8. Weighing Yourself Really Works The scale provides the best evidence about whether your efforts are paying off. Seeing the numbers tick up or down or stagnate is motivation to keep going-or to rethink your approach. A 2015 study at [employer] found that daily weigh-ins helped people lose more weight, keep it off, and maintain that loss, even after two years. Use it to lose it. Step on the scale at the same time every day for the best results. If your weight shoots up several pounds from one weigh-in to the next, don't freak out. Eating a lot of salt the night before or having your period is the likely culprit. The number should return to normal in a day or two. It's a steady climb that you need to do something about. 9. Too Much Stress and Too Little Sleep Are Your Enemies When you're tired and frazzled, your body cranks up the production of cortisol, the stress hormone that can cause carb cravings. Not getting enough sleep also boosts your levels of ghrelin, a hormone associated with hunger, while suppressing leptin, a hormone that signals fullness and satiety. People on a diet who slept only five and a half hours a night for two weeks lost 55 percent less fat and were hungrier than those who slept eight and a half hours, according to a study in the [employer] Journal. Use it to lose it. Prioritize sleep, aiming for seven hours or more a night, which research shows helps lower stress. And make sure you're getting quality zzz's. If a snoring spouse or a fidgety cat wakes you up frequently throughout the night, you may end up getting the equivalent of just four hours of sleep, according to a study from [employer]. Keep pets out of the bedroom, and use a white-noise app to drown out snoring. 10. You Will Hit a plateau-And You Can Bust Through It As you slim down, your body releases much less leptin, the fullness hormone.  If you're  not strength training, start right now. Building muscle can raise your metabolism to help you overcome a   plateau. To keep your body challenged and burning calories, incorporate new moves and more intense intervals into your workouts or add another sweat session to your weekly routine. Alternatively, cut an extra 100 calories or so a day from your diet. Now that you've lost weight, your body simply doesn't need as much fuel.      Since food equals calories, in order to lose weight you must either eat fewer calories, exercise more to burn off calories with activity, or both. Food that is not used to fuel the body is stored as fat. A major component of losing weight is to make smarter food choices. Here's how:  1)   Limit non-nutritious foods, such as: Sugar, honey, syrups and candy Pastries, donuts, pies, cakes and cookies Soft drinks, sweetened juices and alcoholic beverages  2)  Cut down on high-fat foods by: - Choosing poultry, fish or lean red meat - Choosing low-fat cooking methods, such as baking, broiling, steaming, grilling and boiling - Using low-fat or non-fat dairy products - Using vinaigrette, herbs, lemon or fat-free salad dressings - Avoiding fatty meats, such as bacon, sausage, franks, ribs and luncheon meats - Avoiding high-fat snacks like nuts, chips and chocolate - Avoiding fried foods - Using less butter, margarine, oil and mayonnaise - Avoiding high-fat gravies, cream sauces and cream-based soups  3) Eat a variety of foods, including: - Fruit and vegetables that are raw, steamed or baked - Whole grains, breads, cereal, rice and pasta - Dairy products, such as low-fat or non-fat milk or yogurt, low-fat cottage cheese and low-fat cheese - Protein-rich foods like chicken, turkey, fish, lean meat and legumes, or beans  4) Change your eating habits by: - Eat three balanced meals a day to help control your hunger - Watch portion sizes and eat small servings of a variety  of foods - Choose low-calorie snacks - Eat only when you are hungry and stop when you are satisfied - Eat slowly and try not to perform other tasks while eating - Find other activities to distract you from food, such as walking, taking up a hobby or being involved in the community - Include regular exercise in your daily routine ( minimum of 20 min of moderate-intensity exercise at least 5 days/week)  - Find a support group, if necessary, for emotional support in your weight loss journey      Easy ways to cut 100 calories  1. Eat your eggs with hot sauce OR salsa instead of cheese.  Eggs are great for breakfast, but many people consider eggs and cheese to be BFFs. Instead of cheese-1 oz. of cheddar has 114 calories-top your eggs with hot sauce, which contains no calories and helps with satiety and metabolism. Salsa is also a great option!!  2. Top your toast, waffles or pancakes with fresh berries instead of jelly or syrup. Half a cup of berries-fresh, frozen or thawed-has about 40 calories, compared with 2 tbsp. of maple syrup or jelly, which both have about 100 calories. The berries will also give you a good punch of fiber, which helps keep you full and satisfied and won't spike blood sugar quickly like the jelly or syrup. 3. Swap the non-fat latte for black coffee with a splash of half-and-half. Contrary to its name, that non-fat latte has 130 calories and a startling 19g of carbohydrates per 16 oz. serving. Replacing that 'light' drinkable dessert with a black coffee with a splash of half-and-half saves you more than 100 calories per 16 oz.   serving. 4. Sprinkle salads with freeze-dried raspberries instead of dried cranberries. If you want a sweet addition to your nutritious salad, stay away from dried cranberries. They have a whopping 130 calories per  cup and 30g carbohydrates. Instead, sprinkle freeze-dried raspberries guilt-free and save more than 100 calories per  cup serving, adding 3g  of belly-filling fiber. 5. Go for mustard in place of mayo on your sandwich. Mustard can add really nice flavor to any sandwich, and there are tons of varieties, from spicy to honey. A serving of mayo is 95 calories, versus 10 calories in a serving of mustard.  Or try an avocado mayo spread: You can find the recipe few click this link: https://www.californiaavocado.com/recipes/recipe-container/california-avocado-mayo 6. Choose a DIY salad dressing instead of the store-bought kind. Mix Dijon or whole grain mustard with low-fat Kefir or red wine vinegar and garlic. 7. Use hummus as a spread instead of a dip. Use hummus as a spread on a high-fiber cracker or tortilla with a sandwich and save on calories without sacrificing taste. 8. Pick just one salad "accessory." Salad isn't automatically a calorie winner. It's easy to over-accessorize with toppings. Instead of topping your salad with nuts, avocado and cranberries (all three will clock in at 313 calories), just pick one. The next day, choose a different accessory, which will also keep your salad interesting. You don't wear all your jewelry every day, right? 9. Ditch the white pasta in favor of spaghetti squash. One cup of cooked spaghetti squash has about 40 calories, compared with traditional spaghetti, which comes with more than 200. Spaghetti squash is also nutrient-dense. It's a good source of fiber and Vitamins A and C, and it can be eaten just like you would eat pasta-with a great tomato sauce and turkey meatballs or with pesto, tofu and spinach, for example. 10. Dress up your chili, soups and stews with non-fat Greek yogurt instead of sour cream. Just a 'dollop' of sour cream can set you back 115 calories and a whopping 12g of fat-seven of which are of the artery-clogging variety. Added bonus: Greek yogurt is packed with muscle-building protein, calcium and B Vitamins. 11. Mash cauliflower instead of mashed potatoes. One cup of traditional  mashed potatoes-in all their creamy goodness-has more than 200 calories, compared to mashed cauliflower, which you can typically eat for less than 100 calories per 1 cup serving. Cauliflower is a great source of the antioxidant indole-3-carbinol (I3C), which may help reduce the risk of some cancers, like breast cancer. 12. Ditch the ice cream sundae in favor of a Greek yogurt parfait. Instead of a cup of ice cream or fro-yo for dessert, try 1 cup of nonfat Greek yogurt topped with fresh berries and a sprinkle of cacao nibs. Both toppings are packed with antioxidants, which can help reduce cellular inflammation and oxidative damage. And the comparison is a no-brainer: One cup of ice cream has about 275 calories; one cup of frozen yogurt has about 230; and a cup of Greek yogurt has just 130, plus twice the protein, so you're less likely to return to the freezer for a second helping. 13. Put olive oil in a spray container instead of using it directly from the bottle. Each tablespoon of olive oil is 120 calories and 15g of fat. Use a mister instead of pouring it straight into the pan or onto a salad. This allows for portion control and will save you more than 100 calories. 14. When baking, substitute canned pumpkin for butter or oil. Canned   pumpkin-not pumpkin pie mix-is loaded with Vitamin A, which is important for skin and eye health, as well as immunity. And the comparisons are pretty crazy:  cup of canned pumpkin has about 40 calories, compared to butter or oil, which has more than 800 calories. Yes, 800 calories. Applesauce and mashed banana can also serve as good substitutions for butter or oil, usually in a 1:1 ratio. 15. Top casseroles with high-fiber cereal instead of breadcrumbs. Breadcrumbs are typically made with white bread, while breakfast cereals contain 5-9g of fiber per serving. Not only will you save more than 150 calories per  cup serving, the swap will also keep you more full and you'll get  a metabolism boost from the added fiber. 16. Snack on pistachios instead of macadamia nuts. Believe it or not, you get the same amount of calories from 35 pistachios (100 calories) as you would from only five macadamia nuts. 17. Chow down on kale chips rather than potato chips. This is my favorite 'don't knock it 'till you try it' swap. Kale chips are so easy to make at home, and you can spice them up with a little grated parmesan or chili powder. Plus, they're a mere fraction of the calories of potato chips, but with the same crunch factor we crave so often. 18. Add seltzer and some fruit slices to your cocktail instead of soda or fruit juice. One cup of soda or fruit juice can pack on as much as 140 calories. Instead, use seltzer and fruit slices. The fruit provides valuable phytochemicals, such as flavonoids and anthocyanins, which help to combat cancer and stave off the aging process.  

## 2016-10-16 ENCOUNTER — Telehealth: Payer: Self-pay | Admitting: Family Medicine

## 2016-10-16 NOTE — Telephone Encounter (Signed)
Patient is still having trouble sleeping.  She can fall asleep but has trouble staying asleep.  She has tried melatonin and is still having this trouble.  She will sleep for a few hours then wake up.  Discussed caffeine use with patient and sleep.  She says she does not drink caffeine late in the day.  Patient is willing to try something to help her get a full night sleep.  Please advise

## 2016-10-16 NOTE — Telephone Encounter (Signed)
Pt called states she is taking the Melatonin but still experiencing sleeplessness at nite, request provider/ MA call her to discuss maye taking something else--Pt states did at 1st reject Karlyn Agee but now will consider if doctor approves-- Pls call her @ [phone]. --glh

## 2016-10-17 NOTE — Addendum Note (Signed)
Addended by: Amado Coe on: 10/17/2016 11:40 AM   Modules accepted: Orders

## 2016-10-17 NOTE — Telephone Encounter (Signed)
I recommend Ambien CR that will keep her asleep through the evening. Please let her know this is temporary and not meant for long-term treatment.

## 2016-10-17 NOTE — Telephone Encounter (Signed)
Pended ambient request.  Please verify dose and directions.

## 2016-10-18 MED ORDER — ZOLPIDEM TARTRATE ER 12.5 MG PO TBCR: 12.5000 mg | EXTENDED_RELEASE_TABLET | Freq: Every evening | ORAL | 2 refills | Status: DC | PRN

## 2016-10-18 NOTE — Addendum Note (Signed)
Addended by: Amado Coe on: 10/18/2016 10:27 AM   Modules accepted: Orders

## 2016-10-18 NOTE — Telephone Encounter (Signed)
Ambien called into [facility].

## 2016-10-18 NOTE — Telephone Encounter (Signed)
ambien CR 12.5mg  q hs prn sleep  Disp 30 ( 2 RF) WIll need f/up for any additional refills as I need to assess txmnt status and plan

## 2016-11-05 ENCOUNTER — Other Ambulatory Visit: Payer: Self-pay

## 2016-11-05 DIAGNOSIS — F419 Anxiety disorder, unspecified: Secondary | ICD-10-CM

## 2016-11-05 MED ORDER — VENLAFAXINE HCL ER 150 MG PO CP24: ORAL_CAPSULE | ORAL | 1 refills | Status: DC

## 2016-11-05 NOTE — Telephone Encounter (Signed)
Patient request refill on Effexor - sent into [facility]. MPulliam, CMA/RT(R)

## 2016-11-27 ENCOUNTER — Other Ambulatory Visit: Payer: Self-pay

## 2016-11-27 ENCOUNTER — Telehealth: Payer: Self-pay | Admitting: Family Medicine

## 2016-11-27 DIAGNOSIS — F419 Anxiety disorder, unspecified: Secondary | ICD-10-CM

## 2016-11-27 MED ORDER — VENLAFAXINE HCL ER 150 MG PO CP24: ORAL_CAPSULE | ORAL | 1 refills | Status: DC

## 2016-11-27 NOTE — Telephone Encounter (Signed)
Refill called in - patient has appointment 01/03/2017.  Patient notified.  MPulliam, CMA/RT(R)

## 2016-11-27 NOTE — Telephone Encounter (Signed)
Pt called states she requested refill from Pharmacy but they said "no"--Pt is traveling out of the country for vacation and will run out of meds while in [place] request refill on (Venlafaxine-XR/ Effexor-XR 150 MG capsules--- pharmacy/ [facility] on [address], [place].--- Pls call pt if unable to fill. --glh

## 2017-01-03 ENCOUNTER — Other Ambulatory Visit (HOSPITAL_COMMUNITY)
Admission: RE | Admit: 2017-01-03 | Discharge: 2017-01-03 | Disposition: A | Discharge status: Home / Self Care | Payer: 59 | Source: Ambulatory Visit | Attending: Family Medicine | Admitting: Family Medicine

## 2017-01-03 ENCOUNTER — Encounter: Payer: Self-pay | Admitting: Family Medicine

## 2017-01-03 ENCOUNTER — Ambulatory Visit (INDEPENDENT_AMBULATORY_CARE_PROVIDER_SITE_OTHER): Payer: 59 | Admitting: Family Medicine

## 2017-01-03 VITALS — BP 120/80 | HR 78 | Ht 65.0 in | Wt 240.5 lb

## 2017-01-03 DIAGNOSIS — Z124 Encounter for screening for malignant neoplasm of cervix: Secondary | ICD-10-CM | POA: Diagnosis present

## 2017-01-03 DIAGNOSIS — Z23 Encounter for immunization: Secondary | ICD-10-CM | POA: Diagnosis not present

## 2017-01-03 DIAGNOSIS — L910 Hypertrophic scar: Secondary | ICD-10-CM

## 2017-01-03 DIAGNOSIS — Z1283 Encounter for screening for malignant neoplasm of skin: Secondary | ICD-10-CM

## 2017-01-03 DIAGNOSIS — Z1389 Encounter for screening for other disorder: Secondary | ICD-10-CM

## 2017-01-03 DIAGNOSIS — Z1211 Encounter for screening for malignant neoplasm of colon: Secondary | ICD-10-CM

## 2017-01-03 DIAGNOSIS — Z719 Counseling, unspecified: Secondary | ICD-10-CM

## 2017-01-03 DIAGNOSIS — Z1231 Encounter for screening mammogram for malignant neoplasm of breast: Secondary | ICD-10-CM

## 2017-01-03 DIAGNOSIS — Z0000 Encounter for general adult medical examination without abnormal findings: Secondary | ICD-10-CM

## 2017-01-03 MED ORDER — ZOSTER VAC RECOMB ADJUVANTED 50 MCG/0.5ML IM SUSR: 0.5000 mL | Freq: Once | INTRAMUSCULAR | 0 refills | Status: AC

## 2017-01-03 NOTE — Patient Instructions (Addendum)
- We obtained a Pap smear today, along with your routine labs..  We will check for high risk HPV and if it is negative you'll need a repeat in 5 years.   If there are lab abnormalities will bring her back to discuss it in person and or get in touch with you via my chart.  Please be looking for these results in a week or so.  - Call for [facility] appointment [facility]..  - We gave you prescription for a vaccine  ( RZV) which is for herpes zoster.  This is something he will need to get at the pharmacy.  Please note you will need a repeat immunization injection in 2-6 months.  It is a 2 shot immunization.  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  - Referred you for colonoscopy which she agreed to do the first time and then possibly follow-up with Cologuard's in the future if it is completely normal.  - Please follow-up in October for your flu vaccine when you return from [place]    Preventive Care for Adults, Female  A healthy lifestyle and preventive care can promote health and wellness. Preventive health guidelines for women include the following key practices.   A routine yearly physical is a good way to check with your health care provider about your health and preventive screening. It is a chance to share any concerns and updates on your health and to receive a thorough exam.   Visit your dentist for a routine exam and preventive care every 6 months. Brush your teeth twice a day and floss once a day. Good oral hygiene prevents tooth decay and gum disease.   The frequency of eye exams is based on your age, health, family medical history, use of contact lenses, and other factors. Follow your health care provider's recommendations for frequency of eye exams.   Eat a healthy diet. Foods like vegetables, fruits, whole grains, low-fat dairy products, and lean protein foods contain the nutrients you need without too  many calories. Decrease your intake of foods high in solid fats, added sugars, and salt. Eat the right amount of calories for you.Get information about a proper diet from your health care provider, if necessary.   Regular physical exercise is one of the most important things you can do for your health. Most adults should get at least 150 minutes of moderate-intensity exercise (any activity that increases your heart rate and causes you to sweat) each week. In addition, most adults need muscle-strengthening exercises on 2 or more days a week.   Maintain a healthy weight. The body mass index (BMI) is a screening tool to identify possible weight problems. It provides an estimate of body fat based on height and weight. Your health care provider can find your BMI, and can help you achieve or maintain a healthy weight.For adults 20 years and older:   - A BMI below 18.5 is considered underweight.   - A BMI of 18.5 to 24.9 is normal.   - A BMI of 25 to 29.9 is considered overweight.   - A BMI of 30 and above is considered obese.   Maintain normal blood lipids and cholesterol levels by exercising and minimizing your intake of trans and saturated fats.  Eat a balanced diet with plenty of fruit and vegetables. Blood tests for lipids and cholesterol should begin at age 62 and be repeated every 5 years minimum.  If your lipid or cholesterol levels are high, you are over 40, or you are at high risk for heart disease, you may need your cholesterol levels checked more frequently.Ongoing high lipid and cholesterol levels should be treated with medicines if diet and exercise are not working.   If you smoke, find out from your health care provider how to quit. If you do not use tobacco, do not start.   Lung cancer screening is recommended for adults aged 32-80 years who are at high risk for developing lung cancer because of a history of smoking. A yearly low-dose CT scan of the lungs is recommended for people who  have at least a 30-pack-year history of smoking and are a current smoker or have quit within the past 15 years. A pack year of smoking is smoking an average of 1 pack of cigarettes a day for 1 year (for example: 1 pack a day for 30 years or 2 packs a day for 15 years). Yearly screening should continue until the smoker has stopped smoking for at least 15 years. Yearly screening should be stopped for people who develop a health problem that would prevent them from having lung cancer treatment.   If you are pregnant, do not drink alcohol. If you are breastfeeding, be very cautious about drinking alcohol. If you are not pregnant and choose to drink alcohol, do not have more than 1 drink per day. One drink is considered to be 12 ounces (355 mL) of beer, 5 ounces (148 mL) of wine, or 1.5 ounces (44 mL) of liquor.   Avoid use of street drugs. Do not share needles with anyone. Ask for help if you need support or instructions about stopping the use of drugs.   High blood pressure causes heart disease and increases the risk of stroke. Your blood pressure should be checked at least yearly.  Ongoing high blood pressure should be treated with medicines if weight loss and exercise do not work.   If you are 27-76 years old, ask your health care provider if you should take aspirin to prevent strokes.   Diabetes screening involves taking a blood sample to check your fasting blood sugar level. This should be done once every 3 years, after age 22, if you are within normal weight and without risk factors for diabetes. Testing should be considered at a younger age or be carried out more frequently if you are overweight and have at least 1 risk factor for diabetes.   Breast cancer screening is essential preventive care for women. You should practice "breast self-awareness."  This means understanding the normal appearance and feel of your breasts and may include breast self-examination.  Any changes detected, no matter  how small, should be reported to a health care provider.  Women in their 108s and 30s should have a clinical breast exam (CBE) by a health care provider as part of a regular health exam every 1 to 3 years.  After age 15, women should have a CBE every year.  Starting at age 53, women should consider having a mammogram (breast X-ray test) every year.  Women who have a family history of breast cancer should talk to their health care provider about genetic screening.  Women at a high risk of breast cancer should talk to their health care providers about having an MRI and a mammogram every year.   -Breast cancer gene (BRCA)-related cancer risk assessment is recommended for women who have family members with BRCA-related cancers. BRCA-related cancers  include breast, ovarian, tubal, and peritoneal cancers. Having family members with these cancers may be associated with an increased risk for harmful changes (mutations) in the breast cancer genes BRCA1 and BRCA2. Results of the assessment will determine the need for genetic counseling and BRCA1 and BRCA2 testing.   The Pap test is a screening test for cervical cancer. A Pap test can show cell changes on the cervix that might become cervical cancer if left untreated. A Pap test is a procedure in which cells are obtained and examined from the lower end of the uterus (cervix).   - Women should have a Pap test starting at age 72.   - Between ages 11 and 38, Pap tests should be repeated every 2 years.   - Beginning at age 84, you should have a Pap test every 3 years as long as the past 3 Pap tests have been normal.   - Some women have medical problems that increase the chance of getting cervical cancer. Talk to your health care provider about these problems. It is especially important to talk to your health care provider if a new problem develops soon after your last Pap test. In these cases, your health care provider may recommend more frequent screening and Pap  tests.   - The above recommendations are the same for women who have or have not gotten the vaccine for human papillomavirus (HPV).   - If you had a hysterectomy for a problem that was not cancer or a condition that could lead to cancer, then you no longer need Pap tests. Even if you no longer need a Pap test, a regular exam is a good idea to make sure no other problems are starting.   - If you are between ages 35 and 54 years, and you have had normal Pap tests going back 10 years, you no longer need Pap tests. Even if you no longer need a Pap test, a regular exam is a good idea to make sure no other problems are starting.   - If you have had past treatment for cervical cancer or a condition that could lead to cancer, you need Pap tests and screening for cancer for at least 20 years after your treatment.   - If Pap tests have been discontinued, risk factors (such as a new sexual partner) need to be reassessed to determine if screening should be resumed.   - The HPV test is an additional test that may be used for cervical cancer screening. The HPV test looks for the virus that can cause the cell changes on the cervix. The cells collected during the Pap test can be tested for HPV. The HPV test could be used to screen women aged 39 years and older, and should be used in women of any age who have unclear Pap test results. After the age of 30, women should have HPV testing at the same frequency as a Pap test.   Colorectal cancer can be detected and often prevented. Most routine colorectal cancer screening begins at the age of 16 years and continues through age 2 years. However, your health care provider may recommend screening at an earlier age if you have risk factors for colon cancer. On a yearly basis, your health care provider may provide home test kits to check for hidden blood in the stool.  Use of a small camera at the end of a tube, to directly examine the colon (sigmoidoscopy or colonoscopy), can  detect the earliest forms  of colorectal cancer. Talk to your health care provider about this at age 15, when routine screening begins. Direct exam of the colon should be repeated every 5 -10 years through age 36 years, unless early forms of pre-cancerous polyps or small growths are found.   People who are at an increased risk for hepatitis B should be screened for this virus. You are considered at high risk for hepatitis B if:  -You were born in a country where hepatitis B occurs often. Talk with your health care provider about which countries are considered high risk.  - Your parents were born in a high-risk country and you have not received a shot to protect against hepatitis B (hepatitis B vaccine).  - You have HIV or AIDS.  - You use needles to inject street drugs.  - You live with, or have sex with, someone who has Hepatitis B.  - You get hemodialysis treatment.  - You take certain medicines for conditions like cancer, organ transplantation, and autoimmune conditions.   Hepatitis C blood testing is recommended for all people born from [birth year] through 1965 and any individual with known risks for hepatitis C.   Practice safe sex. Use condoms and avoid high-risk sexual practices to reduce the spread of sexually transmitted infections (STIs). STIs include gonorrhea, chlamydia, syphilis, trichomonas, herpes, HPV, and human immunodeficiency virus (HIV). Herpes, HIV, and HPV are viral illnesses that have no cure. They can result in disability, cancer, and death. Sexually active women aged 59 years and younger should be checked for chlamydia. Older women with new or multiple partners should also be tested for chlamydia. Testing for other STIs is recommended if you are sexually active and at increased risk.   Osteoporosis is a disease in which the bones lose minerals and strength with aging. This can result in serious bone fractures or breaks. The risk of osteoporosis can be identified using a bone  density scan. Women ages 67 years and over and women at risk for fractures or osteoporosis should discuss screening with their health care providers. Ask your health care provider whether you should take a calcium supplement or vitamin D to There are also several preventive steps women can take to avoid osteoporosis and resulting fractures or to keep osteoporosis from worsening. -->Recommendations include:  Eat a balanced diet high in fruits, vegetables, calcium, and vitamins.  Get enough calcium. The recommended total intake of is 1,200 mg daily; for best absorption, if taking supplements, divide doses into 250-500 mg doses throughout the day. Of the two types of calcium, calcium carbonate is best absorbed when taken with food but calcium citrate can be taken on an empty stomach.  Get enough vitamin D. [facility] and [facility] recommend at least 1,000 IU per day for women age 36 and over who are at risk of vitamin D deficiency. Vitamin D deficiency can be caused by inadequate sun exposure (for example, those who live in [place]).  Avoid alcohol and smoking. Heavy alcohol intake (more than 7 drinks per week) increases the risk of falls and hip fracture and women smokers tend to lose bone more rapidly and have lower bone mass than nonsmokers. Stopping smoking is one of the most important changes women can make to improve their health and decrease risk for disease.  Be physically active every day. Weight-bearing exercise (for example, fast walking, hiking, jogging, and weight training) may strengthen bones or slow the rate of bone loss that comes with aging. Balancing and muscle-strengthening  exercises can reduce the risk of falling and fracture.  Consider therapeutic medications. Currently, several types of effective drugs are available. Healthcare providers can recommend the type most appropriate for each woman.  Eliminate environmental factors that may contribute to  accidents. Falls cause nearly 90% of all osteoporotic fractures, so reducing this risk is an important bone-health strategy. Measures include ample lighting, removing obstructions to walking, using nonskid rugs on floors, and placing mats and/or grab bars in showers.  Be aware of medication side effects. Some common medicines make bones weaker. These include a type of steroid drug called glucocorticoids used for arthritis and asthma, some antiseizure drugs, certain sleeping pills, treatments for endometriosis, and some cancer drugs. An overactive thyroid gland or using too much thyroid hormone for an underactive thyroid can also be a problem. If you are taking these medicines, talk to your doctor about what you can do to help protect your bones.reduce the rate of osteoporosis.    Menopause can be associated with physical symptoms and risks. Hormone replacement therapy is available to decrease symptoms and risks. You should talk to your health care provider about whether hormone replacement therapy is right for you.   Use sunscreen. Apply sunscreen liberally and repeatedly throughout the day. You should seek shade when your shadow is shorter than you. Protect yourself by wearing long sleeves, pants, a wide-brimmed hat, and sunglasses year round, whenever you are outdoors.   Once a month, do a whole body skin exam, using a mirror to look at the skin on your back. Tell your health care provider of new moles, moles that have irregular borders, moles that are larger than a pencil eraser, or moles that have changed in shape or color.   -Stay current with required vaccines (immunizations).   Influenza vaccine. All adults should be immunized every year.  Tetanus, diphtheria, and acellular pertussis (Td, Tdap) vaccine. Pregnant women should receive 1 dose of Tdap vaccine during each pregnancy. The dose should be obtained regardless of the length of time since the last dose. Immunization is preferred  during the 27th 36th week of gestation. An adult who has not previously received Tdap or who does not know her vaccine status should receive 1 dose of Tdap. This initial dose should be followed by tetanus and diphtheria toxoids (Td) booster doses every 10 years. Adults with an unknown or incomplete history of completing a 3-dose immunization series with Td-containing vaccines should begin or complete a primary immunization series including a Tdap dose. Adults should receive a Td booster every 10 years.  Varicella vaccine. An adult without evidence of immunity to varicella should receive 2 doses or a second dose if she has previously received 1 dose. Pregnant females who do not have evidence of immunity should receive the first dose after pregnancy. This first dose should be obtained before leaving the health care facility. The second dose should be obtained 4 8 weeks after the first dose.  Human papillomavirus (HPV) vaccine. Females aged 41 26 years who have not received the vaccine previously should obtain the 3-dose series. The vaccine is not recommended for use in pregnant females. However, pregnancy testing is not needed before receiving a dose. If a female is found to be pregnant after receiving a dose, no treatment is needed. In that case, the remaining doses should be delayed until after the pregnancy. Immunization is recommended for any person with an immunocompromised condition through the age of 27 years if she did not get any or all doses  earlier. During the 3-dose series, the second dose should be obtained 4 8 weeks after the first dose. The third dose should be obtained 24 weeks after the first dose and 16 weeks after the second dose.  Zoster vaccine. One dose is recommended for adults aged 67 years or older unless certain conditions are present.  Measles, mumps, and rubella (MMR) vaccine. Adults born before [birth year] generally are considered immune to measles and mumps. Adults born in [birth year] or later  should have 1 or more doses of MMR vaccine unless there is a contraindication to the vaccine or there is laboratory evidence of immunity to each of the three diseases. A routine second dose of MMR vaccine should be obtained at least 28 days after the first dose for students attending postsecondary schools, health care workers, or international travelers. People who received inactivated measles vaccine or an unknown type of measles vaccine during 1963 1967 should receive 2 doses of MMR vaccine. People who received inactivated mumps vaccine or an unknown type of mumps vaccine before 1979 and are at high risk for mumps infection should consider immunization with 2 doses of MMR vaccine. For females of childbearing age, rubella immunity should be determined. If there is no evidence of immunity, females who are not pregnant should be vaccinated. If there is no evidence of immunity, females who are pregnant should delay immunization until after pregnancy. Unvaccinated health care workers born before [birth year] who lack laboratory evidence of measles, mumps, or rubella immunity or laboratory confirmation of disease should consider measles and mumps immunization with 2 doses of MMR vaccine or rubella immunization with 1 dose of MMR vaccine.  Pneumococcal 13-valent conjugate (PCV13) vaccine. When indicated, a person who is uncertain of her immunization history and has no record of immunization should receive the PCV13 vaccine. An adult aged 28 years or older who has certain medical conditions and has not been previously immunized should receive 1 dose of PCV13 vaccine. This PCV13 should be followed with a dose of pneumococcal polysaccharide (PPSV23) vaccine. The PPSV23 vaccine dose should be obtained at least 8 weeks after the dose of PCV13 vaccine. An adult aged 9 years or older who has certain medical conditions and previously received 1 or more doses of PPSV23 vaccine should receive 1 dose of PCV13. The PCV13 vaccine dose  should be obtained 1 or more years after the last PPSV23 vaccine dose.  Pneumococcal polysaccharide (PPSV23) vaccine. When PCV13 is also indicated, PCV13 should be obtained first. All adults aged 58 years and older should be immunized. An adult younger than age 90 years who has certain medical conditions should be immunized. Any person who resides in a nursing home or long-term care facility should be immunized. An adult smoker should be immunized. People with an immunocompromised condition and certain other conditions should receive both PCV13 and PPSV23 vaccines. People with human immunodeficiency virus (HIV) infection should be immunized as soon as possible after diagnosis. Immunization during chemotherapy or radiation therapy should be avoided. Routine use of PPSV23 vaccine is not recommended for American Indians, [place] Natives, or people younger than 65 years unless there are medical conditions that require PPSV23 vaccine. When indicated, people who have unknown immunization and have no record of immunization should receive PPSV23 vaccine. One-time revaccination 5 years after the first dose of PPSV23 is recommended for people aged 72 64 years who have chronic kidney failure, nephrotic syndrome, asplenia, or immunocompromised conditions. People who received 1 2 doses of PPSV23 before age 62 years should receive  another dose of PPSV23 vaccine at age 59 years or later if at least 5 years have passed since the previous dose. Doses of PPSV23 are not needed for people immunized with PPSV23 at or after age 2 years.  Meningococcal vaccine. Adults with asplenia or persistent complement component deficiencies should receive 2 doses of quadrivalent meningococcal conjugate (MenACWY-D) vaccine. The doses should be obtained at least 2 months apart. Microbiologists working with certain meningococcal bacteria, [employer] recruits, people at risk during an outbreak, and people who travel to or live in countries with a high  rate of meningitis should be immunized. A first-year college student up through age 24 years who is living in a residence hall should receive a dose if she did not receive a dose on or after her 16th birthday. Adults who have certain high-risk conditions should receive one or more doses of vaccine.  Hepatitis A vaccine. Adults who wish to be protected from this disease, have certain high-risk conditions, work with hepatitis A-infected animals, work in hepatitis A research labs, or travel to or work in countries with a high rate of hepatitis A should be immunized. Adults who were previously unvaccinated and who anticipate close contact with an international adoptee during the first 60 days after arrival in the [place] from a country with a high rate of hepatitis A should be immunized.  Hepatitis B vaccine.  Adults who wish to be protected from this disease, have certain high-risk conditions, may be exposed to blood or other infectious body fluids, are household contacts or sex partners of hepatitis B positive people, are clients or workers in certain care facilities, or travel to or work in countries with a high rate of hepatitis B should be immunized.  Haemophilus influenzae type b (Hib) vaccine. A previously unvaccinated person with asplenia or sickle cell disease or having a scheduled splenectomy should receive 1 dose of Hib vaccine. Regardless of previous immunization, a recipient of a hematopoietic stem cell transplant should receive a 3-dose series 6 12 months after her successful transplant. Hib vaccine is not recommended for adults with HIV infection.  Preventive Services / Frequency Ages 30 to 39years  Blood pressure check.** / Every 1 to 2 years.  Lipid and cholesterol check.** / Every 5 years beginning at age 90.  Clinical breast exam.** / Every 3 years for women in their 88s and 71s.  BRCA-related cancer risk assessment.** / For women who have family members with a BRCA-related  cancer (breast, ovarian, tubal, or peritoneal cancers).  Pap test.** / Every 2 years from ages 35 through 8. Every 3 years starting at age 33 through age 67 or 76 with a history of 3 consecutive normal Pap tests.  HPV screening.** / Every 3 years from ages 54 through ages 6 to 60 with a history of 3 consecutive normal Pap tests.  Hepatitis C blood test.** / For any individual with known risks for hepatitis C.  Skin self-exam. / Monthly.  Influenza vaccine. / Every year.  Tetanus, diphtheria, and acellular pertussis (Tdap, Td) vaccine.** / Consult your health care provider. Pregnant women should receive 1 dose of Tdap vaccine during each pregnancy. 1 dose of Td every 10 years.  Varicella vaccine.** / Consult your health care provider. Pregnant females who do not have evidence of immunity should receive the first dose after pregnancy.  HPV vaccine. / 3 doses over 6 months, if 35 and younger. The vaccine is not recommended for use in pregnant females. However, pregnancy testing is not  needed before receiving a dose.  Measles, mumps, rubella (MMR) vaccine.** / You need at least 1 dose of MMR if you were born in 1957 or later. You may also need a 2nd dose. For females of childbearing age, rubella immunity should be determined. If there is no evidence of immunity, females who are not pregnant should be vaccinated. If there is no evidence of immunity, females who are pregnant should delay immunization until after pregnancy.  Pneumococcal 13-valent conjugate (PCV13) vaccine.** / Consult your health care provider.  Pneumococcal polysaccharide (PPSV23) vaccine.** / 1 to 2 doses if you smoke cigarettes or if you have certain conditions.  Meningococcal vaccine.** / 1 dose if you are age 28 to 83 years and a Market researcher living in a residence hall, or have one of several medical conditions, you need to get vaccinated against meningococcal disease. You may also need additional booster  doses.  Hepatitis A vaccine.** / Consult your health care provider.  Hepatitis B vaccine.** / Consult your health care provider.  Haemophilus influenzae type b (Hib) vaccine.** / Consult your health care provider.  Ages 4 to 64years  Blood pressure check.** / Every 1 to 2 years.  Lipid and cholesterol check.** / Every 5 years beginning at age 10 years.  Lung cancer screening. / Every year if you are aged 52 80 years and have a 30-pack-year history of smoking and currently smoke or have quit within the past 15 years. Yearly screening is stopped once you have quit smoking for at least 15 years or develop a health problem that would prevent you from having lung cancer treatment.  Clinical breast exam.** / Every year after age 31 years.  BRCA-related cancer risk assessment.** / For women who have family members with a BRCA-related cancer (breast, ovarian, tubal, or peritoneal cancers).  Mammogram.** / Every year beginning at age 44 years and continuing for as long as you are in good health. Consult with your health care provider.  Pap test.** / Every 3 years starting at age 20 years through age 60 or 20 years with a history of 3 consecutive normal Pap tests.  HPV screening.** / Every 3 years from ages 46 years through ages 39 to 7 years with a history of 3 consecutive normal Pap tests.  Fecal occult blood test (FOBT) of stool. / Every year beginning at age 88 years and continuing until age 56 years. You may not need to do this test if you get a colonoscopy every 10 years.  Flexible sigmoidoscopy or colonoscopy.** / Every 5 years for a flexible sigmoidoscopy or every 10 years for a colonoscopy beginning at age 50 years and continuing until age 1 years.  Hepatitis C blood test.** / For all people born from [birth year] through 1965 and any individual with known risks for hepatitis C.  Skin self-exam. / Monthly.  Influenza vaccine. / Every year.  Tetanus, diphtheria, and acellular pertussis  (Tdap/Td) vaccine.** / Consult your health care provider. Pregnant women should receive 1 dose of Tdap vaccine during each pregnancy. 1 dose of Td every 10 years.  Varicella vaccine.** / Consult your health care provider. Pregnant females who do not have evidence of immunity should receive the first dose after pregnancy.  Zoster vaccine.** / 1 dose for adults aged 11 years or older.  Measles, mumps, rubella (MMR) vaccine.** / You need at least 1 dose of MMR if you were born in 1957 or later. You may also need a 2nd dose. For females of childbearing  age, rubella immunity should be determined. If there is no evidence of immunity, females who are not pregnant should be vaccinated. If there is no evidence of immunity, females who are pregnant should delay immunization until after pregnancy.  Pneumococcal 13-valent conjugate (PCV13) vaccine.** / Consult your health care provider.  Pneumococcal polysaccharide (PPSV23) vaccine.** / 1 to 2 doses if you smoke cigarettes or if you have certain conditions.  Meningococcal vaccine.** / Consult your health care provider.  Hepatitis A vaccine.** / Consult your health care provider.  Hepatitis B vaccine.** / Consult your health care provider.  Haemophilus influenzae type b (Hib) vaccine.** / Consult your health care provider.  Ages 101 years and over  Blood pressure check.** / Every 1 to 2 years.  Lipid and cholesterol check.** / Every 5 years beginning at age 33 years.  Lung cancer screening. / Every year if you are aged 35 80 years and have a 30-pack-year history of smoking and currently smoke or have quit within the past 15 years. Yearly screening is stopped once you have quit smoking for at least 15 years or develop a health problem that would prevent you from having lung cancer treatment.  Clinical breast exam.** / Every year after age 23 years.  BRCA-related cancer risk assessment.** / For women who have family members with a BRCA-related cancer  (breast, ovarian, tubal, or peritoneal cancers).  Mammogram.** / Every year beginning at age 58 years and continuing for as long as you are in good health. Consult with your health care provider.  Pap test.** / Every 3 years starting at age 88 years through age 110 or 92 years with 3 consecutive normal Pap tests. Testing can be stopped between 65 and 70 years with 3 consecutive normal Pap tests and no abnormal Pap or HPV tests in the past 10 years.  HPV screening.** / Every 3 years from ages 65 years through ages 22 or 60 years with a history of 3 consecutive normal Pap tests. Testing can be stopped between 65 and 70 years with 3 consecutive normal Pap tests and no abnormal Pap or HPV tests in the past 10 years.  Fecal occult blood test (FOBT) of stool. / Every year beginning at age 51 years and continuing until age 75 years. You may not need to do this test if you get a colonoscopy every 10 years.  Flexible sigmoidoscopy or colonoscopy.** / Every 5 years for a flexible sigmoidoscopy or every 10 years for a colonoscopy beginning at age 57 years and continuing until age 39 years.  Hepatitis C blood test.** / For all people born from [birth year] through 1965 and any individual with known risks for hepatitis C.  Osteoporosis screening.** / A one-time screening for women ages 62 years and over and women at risk for fractures or osteoporosis.  Skin self-exam. / Monthly.  Influenza vaccine. / Every year.  Tetanus, diphtheria, and acellular pertussis (Tdap/Td) vaccine.** / 1 dose of Td every 10 years.  Varicella vaccine.** / Consult your health care provider.  Zoster vaccine.** / 1 dose for adults aged 40 years or older.  Pneumococcal 13-valent conjugate (PCV13) vaccine.** / Consult your health care provider.  Pneumococcal polysaccharide (PPSV23) vaccine.** / 1 dose for all adults aged 90 years and older.  Meningococcal vaccine.** / Consult your health care provider.  Hepatitis A vaccine.** /  Consult your health care provider.  Hepatitis B vaccine.** / Consult your health care provider.  Haemophilus influenzae type b (Hib) vaccine.** / Consult your health care  provider. ** Family history and personal history of risk and conditions may change your health care provider's recommendations. Document Released: 06/11/2001 Document Revised: 02/03/2013  [facility] Patient Information 2014 [place], [place].   EXERCISE AND DIET:  We recommended that you start or continue a regular exercise program for good health. Regular exercise means any activity that makes your heart beat faster and makes you sweat.  We recommend exercising at least 30 minutes per day at least 3 days a week, preferably 5.  We also recommend a diet low in fat and sugar / carbohydrates.  Inactivity, poor dietary choices and obesity can cause diabetes, heart attack, stroke, and kidney damage, among others.     ALCOHOL AND SMOKING:  Women should limit their alcohol intake to no more than 7 drinks/beers/glasses of wine (combined, not each!) per week. Moderation of alcohol intake to this level decreases your risk of breast cancer and liver damage.  ( And of course, no recreational drugs are part of a healthy lifestyle.)  Also, you should not be smoking at all or even being exposed to second hand smoke. Most people know smoking can cause cancer, and various heart and lung diseases, but did you know it also contributes to weakening of your bones?  Aging of your skin?  Yellowing of your teeth and nails?   CALCIUM AND VITAMIN D:  Adequate intake of calcium and Vitamin D are recommended.  The recommendations for exact amounts of these supplements seem to change often, but generally speaking 600 mg of calcium (either carbonate or citrate) and 800 units of Vitamin D per day seems prudent. Certain women may benefit from higher intake of Vitamin D.  If you are among these women, your doctor will have told you during your visit.     PAP  SMEARS:  Pap smears, to check for cervical cancer or precancers,  have traditionally been done yearly, although recent scientific advances have shown that most women can have pap smears less often.  However, every woman still should have a physical exam from her gynecologist or primary care physician every year. It will include a breast check, inspection of the vulva and vagina to check for abnormal growths or skin changes, a visual exam of the cervix, and then an exam to evaluate the size and shape of the uterus and ovaries.  And after 56 years of age, a rectal exam is indicated to check for rectal cancers. We will also provide age appropriate advice regarding health maintenance, like when you should have certain vaccines, screening for sexually transmitted diseases, bone density testing, colonoscopy, mammograms, etc.    MAMMOGRAMS:  All women over 79 years old should have a yearly mammogram. Many facilities now offer a "3D" mammogram, which may cost around $50 extra out of pocket. If possible,  we recommend you accept the option to have the 3D mammogram performed.  It both reduces the number of women who will be called back for extra views which then turn out to be normal, and it is better than the routine mammogram at detecting truly abnormal areas.     COLONOSCOPY:  Colonoscopy to screen for colon cancer is recommended for all women at age 54.  We know, you hate the idea of the prep.  We agree, BUT, having colon cancer and not knowing it is worse!!  Colon cancer so often starts as a polyp that can be seen and removed at colonscopy, which can quite literally save your life!  And if your first  colonoscopy is normal and you have no family history of colon cancer, most women don't have to have it again for 10 years.  Once every ten years, you can do something that may end up saving your life, right?  We will be happy to help you get it scheduled when you are ready.  Be sure to check your insurance coverage  so you understand how much it will cost.  It may be covered as a preventative service at no cost, but you should check your particular policy.

## 2017-01-03 NOTE — Progress Notes (Signed)
Impression and Recommendations:    1. Encounter for wellness examination   2. Health education/counseling   3. Screening for multiple conditions   4. Encounter for screening mammogram for breast cancer   5. Colon cancer screening   6. Obesity, Class III, BMI 40-49.9 (morbid obesity) ([place])   7. Hypertrophic acne scar   8. Screening exam for skin cancer   9. Screening for colon cancer   10. Need for Tdap vaccination   11. Need for zoster vaccination    - We obtained a Pap smear today, along with your routine labs..  We will check for high risk HPV and if it is negative you'll need a repeat in 5 years.   If there are lab abnormalities will bring her back to discuss it in person and or get in touch with you via my chart.  Please be looking for these results in a week or so.  - Call for mammogram appointment at the breast Center..  - We gave you prescription for a vaccine  ( RZV) which is for herpes zoster.  This is something he will need to get at the pharmacy.  Please note you will need a repeat immunization injection in 2-6 months.  It is a 2 shot immunization.  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  - Referred you for colonoscopy which she agreed to do the first time and then possibly follow-up with Cologuard's in the future if it is completely normal.  - Please follow-up in October for your flu vaccine when you return from the [place]  Please see orders section below for further details of actions taken during this office visit.  Gross side effects, risk and benefits, and alternatives of medications discussed with patient.  Patient is aware that all medications have potential side effects and we are unable to predict every side effect or drug-drug interaction that may occur.  Expresses verbal understanding and consents to current therapy plan and treatment regiment.  1) Anticipatory Guidance: Discussed importance of  wearing a seatbelt while driving, not texting while driving; sunscreen when outside along with yearly skin surveillance; eating a well balanced and modest diet; physical activity at least 25 minutes per day or 150 min/ week of moderate to intense activity.  2) Immunizations / Screenings / Labs:  All immunizations and screenings that patient agrees to, are up-to-date per recommendations or will be updated today.  Patient understands the needs for q 81mo dental and yearly vision screens which pt will schedule independently. Obtain CBC, CMP, HgA1c, Lipid panel, TSH and vit D when fasting if not already done recently.   3) Weight:   Discussed goal of losing even 5-10% of current body weight which would improve overall feelings of well being and improve objective health data significantly.   Improve nutrient density of diet through increasing intake of fruits and vegetables and decreasing saturated/trans fats, white flour products and refined sugar products.   F-up preventative CPE in 1 year. F/up sooner for chronic care management as discussed and/or prn.    Orders Placed This Encounter  Procedures  . Tdap vaccine greater than or equal to 7yo IM  . CBC with Differential/Platelet  . Comprehensive metabolic panel  . Hepatitis C antibody  . HIV antibody  . Hemoglobin A1c  . Lipid panel  . TSH  . T4, free  . VITAMIN D 25 Hydroxy (Vit-D Deficiency, Fractures)  . Vitamin B12  .  Ambulatory referral to Dermatology  . Ambulatory referral to Gastroenterology     Meds ordered this encounter  Medications  . Zoster Vac Recomb Adjuvanted [facility]) injection    Sig: Inject 0.5 mLs into the muscle once.    Dispense:  0.5 mL    Refill:  0     Discontinued Medications   MELATONIN 3 MG SUBL    Place 3 tablets under the tongue at bedtime as needed. 1-3 tabs      Please see orders placed and AVS handed out to patient at the end of our visit for further patient instructions/ counseling done  pertaining to today's office visit.     Subjective:    Chief Complaint  Patient presents with  . Annual Exam   CC:   HPI: Margaret Schultz is a 56 y.o. female who presents to [place] [facility] today a yearly health maintenance exam.  Health Maintenance Summary Reviewed and updated, unless pt declines services.  I reveiwed all meds with pt.   Aspirin:  baratric sx- no Colonoscopy:   Never had, will order today- no fam hx colon ca Tdap:   needs TD  Zostavax:     Asked  FLU:   Declines Flu vaccine since she is traveling to [place] next week.  Tobacco History Reviewed:   Y -- never Alcohol:    No concerns, no excessive use- none Exercise Habits:   Not meeting AHA guidelines- none STD concerns:   none Drug Use:   None Birth control method:   N/a Pap-  Last one 2015- always N Menses regular:     n/a Lumps or breast concerns:      no Breast Cancer Family History:      No Dexa-   1/9/ 2012- scores were in the positive range; pt declines repeat exam today Health Maintenance  Topic Date Due  . TETANUS/TDAP  04/04/1980  . COLONOSCOPY  04/05/2011  . INFLUENZA VACCINE  11/27/2016  . PAP SMEAR  04/28/2017 (Originally 04/04/1982)  . Hepatitis C Screening  08/27/2017 (Originally March 24, 1961)  . HIV Screening  08/27/2017 (Originally 04/04/1976)  . MAMMOGRAM  10/15/2017     Wt Readings from Last 3 Encounters:  01/03/17 240 lb 8 oz (109.1 kg)  09/16/16 246 lb 3.2 oz (111.7 kg)  04/18/16 247 lb 9.6 oz (112.3 kg)   BP Readings from Last 3 Encounters:  01/03/17 120/80  09/16/16 120/76  04/18/16 122/81   Pulse Readings from Last 3 Encounters:  01/03/17 78  09/16/16 90  04/18/16 83     Past Medical History:  Diagnosis Date  . Anxiety       Past Surgical History:  Procedure Laterality Date  . [place] SURGERY  2008  . COSMETIC SURGERY     2010      Family History  Problem Relation Age of Onset  . Heart attack Mother   . Diabetes  Mother   . Hypertension Mother   . COPD Father       History  Drug Use No  ,   History  Alcohol Use No  ,   History  Smoking Status  . Never Smoker  Smokeless Tobacco  . Never Used  ,   History  Sexual Activity  . Sexual activity: Yes  . Birth control/ protection: None    Current Outpatient Prescriptions on File Prior to Visit  Medication Sig Dispense Refill  . baclofen (LIORESAL) 10 MG tablet Take 1 tablet by mouth  daily as needed.  0  . busPIRone (BUSPAR) 15 MG tablet Take 1 tablet (15 mg total) by mouth 3 (three) times daily as needed. 90 tablet 2  . nabumetone (RELAFEN) 500 MG tablet Take 1 tablet by mouth daily as needed.  0  . QUDEXY XR 100 MG CS24 Take 1 capsule by mouth daily.  1  . venlafaxine XR (EFFEXOR-XR) 150 MG 24 hr capsule Take two tablets once daily. 180 capsule 1  . zolpidem (AMBIEN CR) 12.5 MG CR tablet Take 1 tablet (12.5 mg total) by mouth at bedtime as needed for sleep. 30 tablet 2   No current facility-administered medications on file prior to visit.     Allergies: Patient has no known allergies.  Review of Systems: General:   Denies fever, chills, unexplained weight loss.  Optho/Auditory:   Denies visual changes, blurred vision/LOV Respiratory:   Denies SOB, DOE more than baseline levels.  Cardiovascular:   Denies chest pain, palpitations, new onset peripheral edema  Gastrointestinal:   Denies nausea, vomiting, diarrhea.  Genitourinary: Denies dysuria, freq/ urgency, flank pain or discharge from genitals.  Endocrine:     Denies hot or cold intolerance, polyuria, polydipsia. Musculoskeletal:   Denies unexplained myalgias, joint swelling, unexplained arthralgias, gait problems.  Skin:  Denies rash, suspicious lesions Neurological:     Denies dizziness, unexplained weakness, numbness  Psychiatric/Behavioral:   Denies mood changes, suicidal or homicidal ideations, hallucinations    Objective:    Blood pressure 120/80, pulse 78, height 5'  5" (1.651 m), weight 240 lb 8 oz (109.1 kg). Body mass index is 40.02 kg/m. General Appearance:    Alert, cooperative, no distress, appears stated age  Head:    Normocephalic, without obvious abnormality, atraumatic  Eyes:    PERRL, conjunctiva/corneas clear, EOM's intact, fundi    benign, both eyes  Ears:    Normal TM's and external ear canals, both ears  Nose:   Nares normal, septum midline, mucosa normal, no drainage    or sinus tenderness  Throat:   Lips w/o lesion, mucosa moist, and tongue normal; teeth and   gums normal  Neck:   Supple, symmetrical, trachea midline, no adenopathy;    thyroid:  no enlargement/tenderness/nodules; no carotid   bruit or JVD  Back:     Symmetric, no curvature, ROM normal, no CVA tenderness  Lungs:     Clear to auscultation bilaterally, respirations unlabored, no       Wh/ R/ R  Chest Wall:    No tenderness or gross deformity; normal excursion   Heart:    Regular rate and rhythm, S1 and S2 normal, no murmur, rub   or gallop  Breast Exam:    No tenderness, masses, or nipple abnormality b/l; no d/c  Abdomen:     Soft, non-tender, bowel sounds active all four quadrants, NO   G/R/R, no masses, no organomegaly  Genitalia:    Ext genitalia: without lesion, no rash or discharge, No         tenderness;  Cervix: WNL's w/o discharge or lesion;        Adnexa:  No tenderness or palpable masses   Rectal:   deferred due to getting colonoscopy this year- referral placed  Extremities:   Extremities normal, atraumatic, no cyanosis or gross edema  Pulses:   2+ and symmetric all extremities  Skin:   Warm, dry, Skin color, texture, turgor normal, no obvious rashes or lesions Psych: No HI/SI, judgement and insight good, Euthymic mood. Full Affect.  Neurologic:   CNII-XII intact, normal strength, sensation and reflexes    Throughout

## 2017-01-04 LAB — LIPID PANEL
Chol/HDL Ratio: 2.3 ratio (ref 0.0–4.4)
Cholesterol, Total: 179 mg/dL (ref 100–199)
HDL: 77 mg/dL (ref >39)
LDL Calculated: 91 mg/dL (ref 0–99)
TRIGLYCERIDES: 55 mg/dL (ref 0–149)
VLDL Cholesterol Cal: 11 mg/dL (ref 5–40)

## 2017-01-04 LAB — COMPREHENSIVE METABOLIC PANEL
A/G RATIO: 1.7 (ref 1.2–2.2)
ALT: 17 IU/L (ref 0–32)
AST: 21 IU/L (ref 0–40)
Albumin: 4.3 g/dL (ref 3.5–5.5)
Alkaline Phosphatase: 114 IU/L (ref 39–117)
BILIRUBIN TOTAL: 0.3 mg/dL (ref 0.0–1.2)
BUN/Creatinine Ratio: 38 — ABNORMAL HIGH (ref 9–23)
BUN: 30 mg/dL — ABNORMAL HIGH (ref 6–24)
CALCIUM: 9.8 mg/dL (ref 8.7–10.2)
CHLORIDE: 106 mmol/L (ref 96–106)
CO2: 22 mmol/L (ref 20–29)
Creatinine, Ser: 0.79 mg/dL (ref 0.57–1.00)
GFR, EST AFRICAN AMERICAN: 97 mL/min/{1.73_m2} (ref >59)
GFR, EST NON AFRICAN AMERICAN: 85 mL/min/{1.73_m2} (ref >59)
GLOBULIN, TOTAL: 2.5 g/dL (ref 1.5–4.5)
Glucose: 93 mg/dL (ref 65–99)
POTASSIUM: 4.5 mmol/L (ref 3.5–5.2)
SODIUM: 140 mmol/L (ref 134–144)
Total Protein: 6.8 g/dL (ref 6.0–8.5)

## 2017-01-04 LAB — CBC WITH DIFFERENTIAL/PLATELET
Basophils Absolute: 0.1 10*3/uL (ref 0.0–0.2)
Basos: 1 %
EOS (ABSOLUTE): 0.2 10*3/uL (ref 0.0–0.4)
EOS: 3 %
HEMATOCRIT: 33 % — AB (ref 34.0–46.6)
HEMOGLOBIN: 9.7 g/dL — AB (ref 11.1–15.9)
Immature Grans (Abs): 0 10*3/uL (ref 0.0–0.1)
Immature Granulocytes: 0 %
LYMPHS ABS: 1.7 10*3/uL (ref 0.7–3.1)
Lymphs: 33 %
MCH: 21.5 pg — AB (ref 26.6–33.0)
MCHC: 29.4 g/dL — AB (ref 31.5–35.7)
MCV: 73 fL — ABNORMAL LOW (ref 79–97)
MONOCYTES: 8 %
MONOS ABS: 0.4 10*3/uL (ref 0.1–0.9)
NEUTROS ABS: 2.8 10*3/uL (ref 1.4–7.0)
Neutrophils: 55 %
PLATELETS: 231 10*3/uL (ref 150–379)
RBC: 4.51 x10E6/uL (ref 3.77–5.28)
RDW: 17 % — AB (ref 12.3–15.4)
WBC: 5.1 10*3/uL (ref 3.4–10.8)

## 2017-01-04 LAB — TSH: TSH: 1.78 u[IU]/mL (ref 0.450–4.500)

## 2017-01-04 LAB — VITAMIN B12: VITAMIN B 12: 284 pg/mL (ref 232–1245)

## 2017-01-04 LAB — VITAMIN D 25 HYDROXY (VIT D DEFICIENCY, FRACTURES): VIT D 25 HYDROXY: 27.7 ng/mL — AB (ref 30.0–100.0)

## 2017-01-04 LAB — T4, FREE: Free T4: 1.1 ng/dL (ref 0.82–1.77)

## 2017-01-04 LAB — HEPATITIS C ANTIBODY: Hep C Virus Ab: <0.1 s/co ratio (ref 0.0–0.9)

## 2017-01-04 LAB — HEMOGLOBIN A1C
Est. average glucose Bld gHb Est-mCnc: 114 mg/dL
HEMOGLOBIN A1C: 5.6 % (ref 4.8–5.6)

## 2017-01-04 LAB — HIV ANTIBODY (ROUTINE TESTING W REFLEX): HIV SCREEN 4TH GENERATION: NONREACTIVE

## 2017-01-06 LAB — CYTOLOGY - PAP
Diagnosis: NEGATIVE
HPV: NOT DETECTED

## 2017-01-17 ENCOUNTER — Encounter: Payer: Self-pay | Admitting: Gastroenterology

## 2017-02-03 ENCOUNTER — Other Ambulatory Visit: Payer: Self-pay

## 2017-02-03 DIAGNOSIS — F430 Acute stress reaction: Secondary | ICD-10-CM

## 2017-02-03 DIAGNOSIS — F419 Anxiety disorder, unspecified: Secondary | ICD-10-CM

## 2017-02-03 MED ORDER — BUSPIRONE HCL 15 MG PO TABS: 15.0000 mg | ORAL_TABLET | Freq: Three times a day (TID) | ORAL | 2 refills | Status: DC | PRN

## 2017-02-03 NOTE — Telephone Encounter (Signed)
Pharmacy sent over refill request for Buspirone. MPulliam, CMA/RT(R)

## 2017-02-10 ENCOUNTER — Other Ambulatory Visit: Payer: Self-pay

## 2017-02-14 ENCOUNTER — Other Ambulatory Visit: Payer: Self-pay

## 2017-02-14 NOTE — Telephone Encounter (Signed)
Refill request sent from pharmacy for Ambien 12.5 mg. MPulliam, CMA/RT(R)

## 2017-02-17 NOTE — Telephone Encounter (Signed)
This was discontinued after my discussion with the patient last office visit.  We discussed trazodone, melatonin and others.  Please call the patient and see why she is asking for refill.

## 2017-02-18 NOTE — Telephone Encounter (Signed)
Called and spoke to patient, she states that she has tried the Melatonin and that it did not work well for her.  She was having difficulty going to sleep and staying asleep.  Patient states that she talked with you at her last office visit and that it was decided that she was best to stay on the Ambien. MPulliam, CMA/RT(R)

## 2017-02-19 ENCOUNTER — Other Ambulatory Visit: Payer: Self-pay

## 2017-02-19 DIAGNOSIS — F5102 Adjustment insomnia: Secondary | ICD-10-CM

## 2017-02-19 MED ORDER — ZOLPIDEM TARTRATE ER 12.5 MG PO TBCR: 12.5000 mg | EXTENDED_RELEASE_TABLET | Freq: Every evening | ORAL | 2 refills | Status: DC | PRN

## 2017-03-10 ENCOUNTER — Encounter: Payer: Self-pay | Admitting: Gastroenterology

## 2017-03-10 ENCOUNTER — Ambulatory Visit (AMBULATORY_SURGERY_CENTER): Payer: Self-pay | Admitting: *Deleted

## 2017-03-10 ENCOUNTER — Other Ambulatory Visit: Payer: Self-pay

## 2017-03-10 VITALS — Ht 66.0 in | Wt 239.6 lb

## 2017-03-10 DIAGNOSIS — Z1211 Encounter for screening for malignant neoplasm of colon: Secondary | ICD-10-CM

## 2017-03-10 MED ORDER — NA SULFATE-K SULFATE-MG SULF 17.5-3.13-1.6 GM/177ML PO SOLN: 1.0000 [IU] | Freq: Once | ORAL | 0 refills | Status: AC

## 2017-03-10 NOTE — Progress Notes (Signed)
No egg or soy allergy known to patient  No issues with past sedation with any surgeries  or procedures, no intubation problems  No diet pills per patient No home 02 use per patient  No blood thinners per patient  Pt denies issues with constipation  No A fib or A flutter  EMMI video sent to pt's e mail  

## 2017-03-14 ENCOUNTER — Other Ambulatory Visit: Payer: Self-pay

## 2017-03-14 ENCOUNTER — Encounter: Payer: Self-pay | Admitting: Gastroenterology

## 2017-03-14 ENCOUNTER — Ambulatory Visit (AMBULATORY_SURGERY_CENTER): Payer: 59 | Admitting: Gastroenterology

## 2017-03-14 VITALS — BP 108/50 | HR 52 | Temp 98.0°F | Resp 10 | Ht 65.0 in | Wt 240.0 lb

## 2017-03-14 DIAGNOSIS — Z1211 Encounter for screening for malignant neoplasm of colon: Secondary | ICD-10-CM

## 2017-03-14 DIAGNOSIS — D120 Benign neoplasm of cecum: Secondary | ICD-10-CM

## 2017-03-14 DIAGNOSIS — Z1212 Encounter for screening for malignant neoplasm of rectum: Secondary | ICD-10-CM | POA: Diagnosis not present

## 2017-03-14 MED ORDER — SODIUM CHLORIDE 0.9 % IV SOLN: 500.0000 mL | INTRAVENOUS | Status: DC

## 2017-03-14 NOTE — Patient Instructions (Signed)
Handouts given : polyps, Diverticulosis and Hemorrhoids.    YOU HAD AN ENDOSCOPIC PROCEDURE TODAY AT [facility]:   Refer to the procedure report that was given to you for any specific questions about what was found during the examination.  If the procedure report does not answer your questions, please call your gastroenterologist to clarify.  If you requested that your care partner not be given the details of your procedure findings, then the procedure report has been included in a sealed envelope for you to review at your convenience later.  YOU SHOULD EXPECT: Some feelings of bloating in the abdomen. Passage of more gas than usual.  Walking can help get rid of the air that was put into your GI tract during the procedure and reduce the bloating. If you had a lower endoscopy (such as a colonoscopy or flexible sigmoidoscopy) you may notice spotting of blood in your stool or on the toilet paper. If you underwent a bowel prep for your procedure, you may not have a normal bowel movement for a few days.  Please Note:  You might notice some irritation and congestion in your nose or some drainage.  This is from the oxygen used during your procedure.  There is no need for concern and it should clear up in a day or so.  SYMPTOMS TO REPORT IMMEDIATELY:   Following lower endoscopy (colonoscopy or flexible sigmoidoscopy):  Excessive amounts of blood in the stool  Significant tenderness or worsening of abdominal pains  Swelling of the abdomen that is new, acute  Fever of 100F or higher   For urgent or emergent issues, a gastroenterologist can be reached at any hour by calling [phone].   DIET:  We do recommend a small meal at first, but then you may proceed to your regular diet.  Drink plenty of fluids but you should avoid alcoholic beverages for 24 hours.  ACTIVITY:  You should plan to take it easy for the rest of today and you should NOT DRIVE or use heavy machinery until  tomorrow (because of the sedation medicines used during the test).    FOLLOW UP: Our staff will call the number listed on your records the next business day following your procedure to check on you and address any questions or concerns that you may have regarding the information given to you following your procedure. If we do not reach you, we will leave a message.  However, if you are feeling well and you are not experiencing any problems, there is no need to return our call.  We will assume that you have returned to your regular daily activities without incident.  If any biopsies were taken you will be contacted by phone or by letter within the next 1-3 weeks.  Please call us at [phone] if you have not heard about the biopsies in 3 weeks.    SIGNATURES/CONFIDENTIALITY: You and/or your care partner have signed paperwork which will be entered into your electronic medical record.  These signatures attest to the fact that that the information above on your After Visit Summary has been reviewed and is understood.  Full responsibility of the confidentiality of this discharge information lies with you and/or your care-partner.

## 2017-03-14 NOTE — Progress Notes (Signed)
Report given to PACU, vss 

## 2017-03-14 NOTE — Op Note (Signed)
[place] Patient Name: Margaret Schultz Procedure Date: 03/14/2017 8:20 AM MRN: [id number] Endoscopist: Remo Lipps P. Armbruster MD, MD Age: 56 Referring MD:  Date of Birth: 13-Nov-1960 Gender: Female Account #: [id number] Procedure:                Colonoscopy Indications:              Screening for colorectal malignant neoplasm, This                            is the patient's first colonoscopy Medicines:                Monitored Anesthesia Care Procedure:                Pre-Anesthesia Assessment:                           - Prior to the procedure, a History and Physical                            was performed, and patient medications and                            allergies were reviewed. The patient's tolerance of                            previous anesthesia was also reviewed. The risks                            and benefits of the procedure and the sedation                            options and risks were discussed with the patient.                            All questions were answered, and informed consent                            was obtained. Prior Anticoagulants: The patient has                            taken no previous anticoagulant or antiplatelet                            agents. ASA Grade Assessment: III - A patient with                            severe systemic disease. After reviewing the risks                            and benefits, the patient was deemed in                            satisfactory condition to undergo the procedure.  After obtaining informed consent, the colonoscope                            was passed under direct vision. Throughout the                            procedure, the patient's blood pressure, pulse, and                            oxygen saturations were monitored continuously. The                            Model PCF-H190DL [id number]) scope was introduced                            through the  anus and advanced to the the cecum,                            identified by appendiceal orifice and ileocecal                            valve. The colonoscopy was performed without                            difficulty. The patient tolerated the procedure                            well. The quality of the bowel preparation was                            good. The ileocecal valve, appendiceal orifice, and                            rectum were photographed. Scope In: 8:25:24 AM Scope Out: 8:46:01 AM Scope Withdrawal Time: 0 hours 17 minutes 23 seconds  Total Procedure Duration: 0 hours 20 minutes 37 seconds  Findings:                 The perianal and digital rectal examinations were                            normal.                           Two sessile polyps were found in the cecum. The                            polyps were diminutive in size. These polyps were                            removed with a cold snare. Resection and retrieval                            were complete.  A few small-mouthed diverticula were found in the                            sigmoid colon.                           Internal hemorrhoids were found during                            retroflexion. The hemorrhoids were small.                           The colon was tortuous.                           The exam was otherwise without abnormality. Complications:            No immediate complications. Estimated blood loss:                            Minimal. Estimated Blood Loss:     Estimated blood loss was minimal. Impression:               - Two diminutive polyps in the cecum, removed with                            a cold snare. Resected and retrieved.                           - Diverticulosis in the sigmoid colon.                           - Internal hemorrhoids.                           - Tortuous colon.                           - The examination was otherwise  normal. Recommendation:           - Patient has a contact number available for                            emergencies. The signs and symptoms of potential                            delayed complications were discussed with the                            patient. Return to normal activities tomorrow.                            Written discharge instructions were provided to the                            patient.                           -  Resume previous diet.                           - Continue present medications.                           - Await pathology results.                           - Repeat colonoscopy is recommended for                            surveillance. The colonoscopy date will be                            determined after pathology results from today's                            exam become available for review. Remo Lipps P. Armbruster MD, MD 03/14/2017 8:49:28 AM This report has been signed electronically.

## 2017-03-14 NOTE — Progress Notes (Signed)
Called to room to assist during endoscopic procedure.  Patient ID and intended procedure confirmed with present staff. Received instructions for my participation in the procedure from the performing physician.  

## 2017-03-17 ENCOUNTER — Telehealth: Payer: Self-pay

## 2017-03-17 NOTE — Telephone Encounter (Signed)
  Follow up Call-  Call Lashone Stauber number 03/14/2017  Post procedure Call Greyden Besecker phone  # [phone]  Permission to leave phone message Yes     Patient questions:  Do you have a fever, pain , or abdominal swelling? No. Pain Score  0 *  Have you tolerated food without any problems? Yes.    Have you been able to return to your normal activities? Yes.    Do you have any questions about your discharge instructions: Diet   No. Medications  No. Follow up visit  No.  Do you have questions or concerns about your Care? No.  Actions: * If pain score is 4 or above: No action needed, pain <4.

## 2017-03-18 ENCOUNTER — Other Ambulatory Visit: Payer: Self-pay

## 2017-03-18 ENCOUNTER — Telehealth: Payer: Self-pay | Admitting: Family Medicine

## 2017-03-18 DIAGNOSIS — F5102 Adjustment insomnia: Secondary | ICD-10-CM

## 2017-03-18 NOTE — Telephone Encounter (Signed)
Sent request to Dr. Opalski to review. MPulliam, CMA/RT(R)  

## 2017-03-18 NOTE — Telephone Encounter (Signed)
Patient requesting refill on Ambien, sent to Dr. Raliegh Scarlet for review. MPulliam, CMA/RT(R)

## 2017-03-18 NOTE — Telephone Encounter (Signed)
Patient called states needs her refill early, she is going out of town [facility]) till Sunday.   -------Patient as only (3) pills left from current Rx.  ----Pt request Rx refill on Zolpidem/ Ambien CR- 12.5 MG CR tablets --- takes 1 daily.  Please use new pharmacy ( [facility] on [address]  ---glh

## 2017-03-19 ENCOUNTER — Encounter: Payer: Self-pay | Admitting: Gastroenterology

## 2017-03-19 MED ORDER — ZOLPIDEM TARTRATE ER 12.5 MG PO TBCR: 12.5000 mg | EXTENDED_RELEASE_TABLET | Freq: Every evening | ORAL | 2 refills | Status: DC | PRN

## 2017-05-09 ENCOUNTER — Ambulatory Visit (INDEPENDENT_AMBULATORY_CARE_PROVIDER_SITE_OTHER): Payer: 59 | Admitting: Family Medicine

## 2017-05-09 ENCOUNTER — Encounter: Payer: Self-pay | Admitting: Family Medicine

## 2017-05-09 VITALS — BP 127/77 | HR 88 | Ht 65.0 in | Wt 241.2 lb

## 2017-05-09 DIAGNOSIS — Z586 Inadequate drinking-water supply: Secondary | ICD-10-CM

## 2017-05-09 DIAGNOSIS — E559 Vitamin D deficiency, unspecified: Secondary | ICD-10-CM

## 2017-05-09 DIAGNOSIS — Z91199 Patient's noncompliance with other medical treatment and regimen due to unspecified reason: Secondary | ICD-10-CM

## 2017-05-09 DIAGNOSIS — F430 Acute stress reaction: Secondary | ICD-10-CM | POA: Diagnosis not present

## 2017-05-09 DIAGNOSIS — F5102 Adjustment insomnia: Secondary | ICD-10-CM

## 2017-05-09 DIAGNOSIS — D508 Other iron deficiency anemias: Secondary | ICD-10-CM

## 2017-05-09 DIAGNOSIS — R1031 Right lower quadrant pain: Secondary | ICD-10-CM | POA: Insufficient documentation

## 2017-05-09 DIAGNOSIS — F419 Anxiety disorder, unspecified: Secondary | ICD-10-CM

## 2017-05-09 DIAGNOSIS — Z594 Lack of adequate food and safe drinking water: Secondary | ICD-10-CM | POA: Diagnosis not present

## 2017-05-09 DIAGNOSIS — Z9884 Bariatric surgery status: Secondary | ICD-10-CM

## 2017-05-09 DIAGNOSIS — Z9119 Patient's noncompliance with other medical treatment and regimen: Secondary | ICD-10-CM | POA: Diagnosis not present

## 2017-05-09 DIAGNOSIS — R5382 Chronic fatigue, unspecified: Secondary | ICD-10-CM | POA: Diagnosis not present

## 2017-05-09 MED ORDER — VITAMIN D (ERGOCALCIFEROL) 1.25 MG (50000 UNIT) PO CAPS: 50000.0000 [IU] | ORAL_CAPSULE | ORAL | 10 refills | Status: AC

## 2017-05-09 MED ORDER — VENLAFAXINE HCL ER 150 MG PO CP24: ORAL_CAPSULE | ORAL | 1 refills | Status: DC

## 2017-05-09 MED ORDER — ZOLPIDEM TARTRATE ER 12.5 MG PO TBCR: 12.5000 mg | EXTENDED_RELEASE_TABLET | Freq: Every evening | ORAL | 1 refills | Status: DC | PRN

## 2017-05-09 MED ORDER — BUSPIRONE HCL 15 MG PO TABS: 15.0000 mg | ORAL_TABLET | Freq: Three times a day (TID) | ORAL | 1 refills | Status: DC | PRN

## 2017-05-09 MED ORDER — FERROUS SULFATE 325 (65 FE) MG PO TBEC: 325.0000 mg | DELAYED_RELEASE_TABLET | Freq: Three times a day (TID) | ORAL | 5 refills | Status: AC

## 2017-05-09 NOTE — Progress Notes (Signed)
Impression and Recommendations:    1. Abdominal discomfort in right side of abd - only since after colonoscopy   2. S/P gastric bypass- 2007- in [place]   3. Acute reaction to stress   4. Anxiety   5. Insomnia due to stress   6. Other iron deficiency anemia   7. Obesity, Class III, BMI 40-49.9 (morbid obesity) ([place])   8. Medical non-compliance   9. Vitamin D deficiency   10. Inadequate water intake   11. Chronic fatigue- due to anemia      GI Concerns -Patient was reassured that eating chicken and fish instead of red meats is a good thing. If she is having discomfort after she eats certain foods, especially after her colonoscopy, we need to monitor this.  - Patient should keep a food & symptom journal to see what is causing the pain. Document whe you get the symptoms, what you were eating, before or after stooling, with constipation, without, etc. - pay attention to bowels, pain, and what you are eating.  - Pt should avoid foods that cause gas - milk, beans, cheese. Patient should do a Producer, television/film/video for "foods that cause gas," and "lactose intolerance diet."  - Patient is willing to study her food journal and return in one month. At that time, if symptoms have not improved and a pattern has not been found, we will consider referral to Dr. Havery Moros in gastroenterology.   Anemia - She should go to a natural foods store such as [employer], and ask for enteric coated gel iron tablets. Otherwise, we can write a prescription for her- which I did in addition.  - In the future, if her symptoms have not resolved, we can look into other options for iron. We will check CBC next OV suince she c/o some tiredness/ fatigue more than usual but admits she is not taking her Fe supp at all.   Mood  -Stable on meds.  Will refill.  Patient does not feel she needs a change in dose.  - Pt should continue her salsa exercises, and was advised that more exercise can help relieve stress.  -  Patient will return fasting for her one month follow-up.    Orders Placed This Encounter  Procedures  . CBC with Differential/Platelet  . Basic metabolic panel    Meds ordered this encounter  Medications  . busPIRone (BUSPAR) 15 MG tablet    Sig: Take 1 tablet (15 mg total) by mouth 3 (three) times daily as needed.    Dispense:  270 tablet    Refill:  1  . venlafaxine XR (EFFEXOR-XR) 150 MG 24 hr capsule    Sig: Take two tablets once daily.    Dispense:  180 capsule    Refill:  1  . zolpidem (AMBIEN CR) 12.5 MG CR tablet    Sig: Take 1 tablet (12.5 mg total) by mouth at bedtime as needed for sleep.    Dispense:  90 tablet    Refill:  1  . ferrous sulfate 325 (65 FE) MG EC tablet    Sig: Take 1 tablet (325 mg total) by mouth 3 (three) times daily with meals.    Dispense:  90 tablet    Refill:  5  . Vitamin D, Ergocalciferol, (DRISDOL) 50000 units CAPS capsule    Sig: Take 1 capsule (50,000 Units total) by mouth every 7 (seven) days.    Dispense:  12 capsule    Refill:  10  Gross side effects, risk and benefits, and alternatives of medications and treatment plan in general discussed with patient.  Patient is aware that all medications have potential side effects and we are unable to predict every side effect or drug-drug interaction that may occur.   Patient will call with any questions prior to using medication if they have concerns.  Expresses verbal understanding and consents to current therapy and treatment regimen.  No barriers to understanding were identified.  Red flag symptoms and signs discussed in detail.  Patient expressed understanding regarding what to do in case of emergency\urgent symptoms  Please see AVS handed out to patient at the end of our visit for further patient instructions/ counseling done pertaining to today's office visit.   Return in about 4 weeks (around 06/06/2017) for Follow-up right sided abdominal discomfort that is relieved with stooling.      Note: This note was prepared with assistance of Dragon voice recognition software. Occasional wrong-word or sound-a-like substitutions may have occurred due to the inherent limitations of voice recognition software.   This document serves as a record of services personally performed by Mellody Dance, DO. It was created on her behalf by Toni Amend, a trained medical scribe. The creation of this record is based on the scribe's personal observations and the provider's statements to them.   I have reviewed the above medical documentation for accuracy and completeness and I concur.  Mellody Dance 05/09/17 9:10 AM   --------------------------------------------------------------------------------------------------------------------------------------------------------------------------------------------------------------------------------------------    Subjective:     HPI: Margaret Schultz is a 57 y.o. female who presents to [place] at [facility] today for issues as discussed below.  Patient has two tihngs to discuss during her appointment today.  GI Concerns Pt experiencing abdominal pain and GI upset since her recent colonoscopy 03/14/2017.   Colonoscopy was negative besides benign polypectomy, but afterwards, every time that she eats something "heavy," such as red meat, she doesn't like it.  Chicken and fish, she likes. Meats and "heavy" foods she feels are "not processing well."    She feels a pinch going through her right lower and upper abdominal area after eating, and just before going to the bathroom. After she goes to the bathroom, this feeling is relieved. Overall, however, she notes that this doesn't feel good.  She's reduced her eating habits to coffee, sandwich, milk, sometimes beans, sometimes cookies and pasta. But when she has to go to the bathroom, right before that, she has pain in her abdomen. She just gets the sharp, stabbing right-sided  abdominal pain prior to going to the bathroom. When she feels her food passing on the right side of the colon, she feels a pain/pressure/gas. Once she goes to the bathroom, she feels better. Pain does not return until she has to go to the bathroom again, or has gas.  No more gas than usual, no constipation, no diarrhea.     Anemia Pt has a history of anemia.   Lately she feels weak, is losing hair, and otherwise believes she is becoming anemic again.  In the past, after her bariatric surgery, she was given a big solid iron tablet that she did not like. This supplemental iron caused her GI upset.  Iron injections were suggested to the patient as an alternative to oral supplement.   She has not picked up the coated tablet she was prescribed before.  Mood She likes the Buspar, and is using it as prescribed.  The problem with her mood is  mostly caused by the stress at work/circumstances surrounding her.  She would like to keep her medications as they are.  She continues with her salsa exercises.   Wt Readings from Last 3 Encounters:  05/09/17 241 lb 3.2 oz (109.4 kg)  03/14/17 240 lb (108.9 kg)  03/10/17 239 lb 9.6 oz (108.7 kg)   BP Readings from Last 3 Encounters:  05/09/17 127/77  03/14/17 (!) 108/50  01/03/17 120/80   Pulse Readings from Last 3 Encounters:  05/09/17 88  03/14/17 (!) 52  01/03/17 78   BMI Readings from Last 3 Encounters:  05/09/17 40.14 kg/m  03/14/17 39.94 kg/m  03/10/17 38.67 kg/m     Patient Care Team    Relationship Specialty Notifications Start End  Mellody Dance, DO PCP - General Family Medicine  09/21/15      Patient Active Problem List   Diagnosis Date Noted  . Medical non-compliance 05/09/2017  . Abdominal discomfort in right side of abd - only since after colonoscopy 05/09/2017  . Vitamin D deficiency 05/09/2017  . S/P gastric bypass- 2007- in [place] 04/18/2016  . Intractable headache 11/14/2015  . Acute reaction to stress  11/14/2015  . Headache, migraine 09/21/2015  . Obesity, Class III, BMI 40-49.9 (morbid obesity) ([place]) 09/21/2015  . Myalgia and myositis 09/21/2015  . Insomnia due to stress 09/21/2015  . Anxiety 09/21/2015  . Anemia, iron deficiency 12/15/1990    Past Medical history, Surgical history, Family history, Social history, Allergies and Medications have been entered into the medical record, reviewed and changed as needed.    Current Meds  Medication Sig  . baclofen (LIORESAL) 10 MG tablet Take 1 tablet by mouth daily as needed.  . busPIRone (BUSPAR) 15 MG tablet Take 1 tablet (15 mg total) by mouth 3 (three) times daily as needed.  . nabumetone (RELAFEN) 500 MG tablet Take 1 tablet by mouth daily as needed.  . Topiramate ER (QUDEXY XR) 200 MG CS24 sprinkle capsule Take 1 capsule daily by mouth.  . venlafaxine XR (EFFEXOR-XR) 150 MG 24 hr capsule Take two tablets once daily.  Marland Kitchen zolpidem (AMBIEN CR) 12.5 MG CR tablet Take 1 tablet (12.5 mg total) by mouth at bedtime as needed for sleep.  . [DISCONTINUED] busPIRone (BUSPAR) 15 MG tablet Take 1 tablet (15 mg total) by mouth 3 (three) times daily as needed. (Patient taking differently: Take 15 mg daily by mouth. )  . [DISCONTINUED] venlafaxine XR (EFFEXOR-XR) 150 MG 24 hr capsule Take two tablets once daily.  . [DISCONTINUED] zolpidem (AMBIEN CR) 12.5 MG CR tablet Take 1 tablet (12.5 mg total) by mouth at bedtime as needed for sleep.    Allergies:  Allergies  Allergen Reactions  . Iodine Rash     Review of Systems:  A fourteen system review of systems was performed and found to be positive as per HPI.   Objective:   Blood pressure 127/77, pulse 88, height 5\' 5"  (1.651 m), weight 241 lb 3.2 oz (109.4 kg), SpO2 98 %. Body mass index is 40.14 kg/m. General:  Well Developed, well nourished, appropriate for stated age.  Neuro:  Alert and oriented,  extra-ocular muscles intact  HEENT:  Normocephalic, atraumatic, neck supple, no carotid  bruits appreciated  Skin:  no gross rash, warm, pink. Cardiac:  RRR, S1 S2 Respiratory:  ECTA B/L and A/P, Not using accessory muscles, speaking in full sentences- unlabored. ABD: No guarding rigidity rebound, positive bowel sounds x4, no organomegaly. Vascular:  Ext warm, no cyanosis apprec.; cap  RF less 2 sec. Psych:  No HI/SI, judgement and insight good, Euthymic mood. Full Affect.

## 2017-05-09 NOTE — Patient Instructions (Addendum)
Keep a food, symptom, and stooling journal.  Please see if your pains are related to foods that cause a lot of gas or often dairy products containing lactose can often cause discomfort as well.  Please follow-up in 1 month so we can readdress this and see how you are doing.  Meds refilled today.  Please try to work on stress management including exercise, proper diet, meditation or prayer etc.   - If you have insomnia or difficulty sleeping, this information is for you:  - Avoid caffeinated beverages after lunch,  no alcoholic beverages,  no eating within 2-3 hours of lying down,  avoid exposure to blue light before bed,  avoid daytime naps, and  needs to maintain a regular sleep schedule- go to sleep and wake up around the same time every night.   - Resolve concerns or worries before entering bedroom:  Discussed relaxation techniques with patient and to keep a journal to write down fears\ worries.  I suggested seeing a counselor for CBT.   - Recommend patient meditate or do deep breathing exercises to help relax.   Incorporate the use of white noise machines or listen to "sleep meditation music", or recordings of guided meditations for sleep from YouTube which are free, such as  "guided meditation for detachment from over thinking"  by Mayford Knife.       What is Chronic Stress Syndrome, Symptoms & Ways to Deal With it   What is Chronic Stress Syndrome?  Chronic Stress Syndrome is something which can now be called as a medical condition due to the amount of stress an individual is going through these days. Chronic Stress Syndrome causes the body and mind to shutdown and the person has no control over himself or herself. Due to the demands of modern day life and the hardship throughout day and night takes its toll over a period of time and the body and brain starts demanding rest and a break. This leads to certain symptoms where your performance level starts to dip at work, you become  irritable both at work and at home, you may stop enjoying activities you previously liked, you may become depressed, you may get angry for even small things. Chronic Stress Syndrome can significantly impact your quality life. Thus it is important understand the symptoms of Chronic Stress Syndrome and react accordingly in order to cope up with it.  It is important to note here that a balanced work-home equation should be drawn to cut down symptoms of Chronic Stress Syndrome. Minor stressors can be overcome by the body's inbuilt stress response but when there is unending stress for a long period of time then an external help is required to ease the stress.  Chronic Stress Syndrome can physically and psychologically drain you over a period of time. For such cases stress management is the best way to cope up with Chronic Stress Syndrome. If Chronic Stress Syndrome is not treated then it may result in many health hazards like anxiety, muscle pain, insomnia, and high blood pressure along with a compromised immune system leading to frequent infections and missed days from work.    What are the Symptoms of Chronic Stress Syndrome?   The symptoms of Chronic Stress Syndrome are variable and range from generalized symptoms to emotional symptoms along with behavioral and cognitive symptoms. Some of these symptoms have been delineated below:  Generalized Symptoms of Chronic Stress Syndrome are: Anxiety Depression Social isolation Headache Abdominal pain Lack of sleep Back pain Difficulty  in concentrating Hypertension Hemorrhoids Varicose veins Panic attacks/ Panic disorder Cardiovascular diseases.   Some of the Emotional Symptoms of Chronic Stress Syndrome are: To become easily agitated, moody and frustrated Feeling overwhelmed which makes you feel like you are losing control. Having difficulty relaxing and have a peaceful mind Having low self esteem Feeling lonely Feeling worthless Feeling  depressed Avoiding social environment.   Some of the Physical Symptoms of Chronic Stress Syndrome are: Headaches Lethargy Alternating diarrhea and constipation Nausea Muscles aches and pains Insomnia Rapid heartbeat and chest pain Infections and frequent colds Decreased libido Nervousness and shaking Tinnitus Sweaty palms Dry mouth Clenched jaw.  Some of the Cognitive Symptoms of Chronic Stress Syndrome are: Constant worrying Racing thoughts Disorganization and forgetfulness Inability to focus Poor judgment Abundance of negativity.  Some of the Behavioral Symptoms of Chronic Stress Syndrome are: Changes in appetite with less desire to eat Avoiding responsibilities Indulgence in alcohol or recreational drug use Increased nail biting and being fidgety Ways to Deal With Chronic Stress Syndrome    Chronic Stress Syndrome is not something which cannot be addressed. A bit of effort from your side in the form of lifestyle modifications, a little bit of exercise, a balanced work life equation can do wonders and help you get rid of Chronic Stress Syndrome.  Get Proper Sleep: It has been proved that Chronic Stress Syndrome causes loss of sleep where an individual may not even be able to sleep for days unending. This may result in the individual feeling lethargic and unable to focus at work the following morning. This may lead to decreased performance at work. Thus, it is important to have a good sleep-wake cycle. For this, try and not drink any caffeinated beverage about four hours prior to going to sleep, as caffeine pumps up the adrenaline and causes you to stay awake resulting ultimately in Chronic Stress Syndrome.  Avoid Alcohol and Drugs: Another way to get rid of Chronic Stress Syndrome is lifestyle modifications. Stay away from alcohol and other recreational drugs. Take Short Frequent Breaks at Work: Try to take frequent breaks from work and do not work continuously. Try and  manage your work in such a way that you even meet your deadline and come home on time for a happy dinner with family. A good time spent with family and kids does wonders in not only dealing with Chronic Stress Syndrome but also preventing it.  Become Physically Active: Another step towards getting rid of Chronic Stress Syndrome is physical activity. If you do not have time to spend at the gym then at least try and go for daily walks for about half an hour a day which not only keeps the stress away but also is good for your overall health. Physical activity leads to production of endorphins which will make you feel relaxed and feel good.  Healthy Diet Can Help You Deal With Chronic Stress Syndrome: Have a balanced and healthy diet is another step towards a stress free life and keeping Chronic Stress Syndrome at [place]. If time is a constraint then you can try eating three small meals a day. Try and avoid fast foods and take foods which are healthy and rich in proteins, fiber, and carbohydrates to boost your energy system.  Music Can Soothe Your Mind: Light music is one of the best and most effective relaxation techniques that one can try to overcome stress. It has shown to calm down the mind and take you away from all the stressors  that you may be having. These days it is also being used as a therapy in some institutes for overcoming stress. It is important here to discuss the importance of a good social support system for patients with Chronic Stress Syndrome, as a good social support framework can do wonders in taking the stress away from the patient and overcoming Chronic Stress Syndrome.  Meditation Can Help You Deal With Chronic Stress Syndrome Effectively: Meditation and yoga has also shown to be quite effective in relaxing the mind and coping up with Chronic Stress Syndrome   In cases where these measures are not helpful, then it is time for you to consult with a skilled psychologist or a psychiatrist  for potential therapies or medications to control the stress response.   The psychologist can help you with a variety of steps for coping up with Chronic Stress Syndrome. Relaxation techniques and behavioral therapy are some of the methods employed by psychologists. In some cases, medications can also be given to help relax the patient.  Since Chronic Stress Syndrome is both emotionally and physically draining for the patient and it also adversely affects the family life of the patient hence it is important for the patient to recognize the condition and taking steps to cope up with it. Escaping measures like alcohol and drug use are of no help as they only aggravate the condition apart from their other health hazards. If this condition is ignored or left untreated it can lead to various medical conditions like anxiety and depression and various other medical conditions.  Last but not least, smile as often as you can as it is the best gift that you can give to someone. The best way to stay relaxed is to have a good smile, exercise daily, spend time with your family, meditation and if required consultation with a good psychologist so that you can live a stress free life and overcome the symptoms of Chronic Stress Syndrome.

## 2017-06-10 ENCOUNTER — Ambulatory Visit: Payer: 59 | Admitting: Family Medicine

## 2017-07-08 ENCOUNTER — Ambulatory Visit: Payer: 59 | Admitting: Family Medicine

## 2017-07-08 ENCOUNTER — Encounter: Payer: Self-pay | Admitting: Family Medicine

## 2017-07-08 VITALS — BP 130/82 | HR 70 | Ht 65.0 in | Wt 242.7 lb

## 2017-07-08 DIAGNOSIS — R519 Headache, unspecified: Secondary | ICD-10-CM

## 2017-07-08 DIAGNOSIS — R51 Headache: Secondary | ICD-10-CM | POA: Diagnosis not present

## 2017-07-08 DIAGNOSIS — Z9884 Bariatric surgery status: Secondary | ICD-10-CM | POA: Diagnosis not present

## 2017-07-08 DIAGNOSIS — D229 Melanocytic nevi, unspecified: Secondary | ICD-10-CM | POA: Insufficient documentation

## 2017-07-08 DIAGNOSIS — R1031 Right lower quadrant pain: Secondary | ICD-10-CM | POA: Diagnosis not present

## 2017-07-08 DIAGNOSIS — K219 Gastro-esophageal reflux disease without esophagitis: Secondary | ICD-10-CM | POA: Diagnosis not present

## 2017-07-08 DIAGNOSIS — D508 Other iron deficiency anemias: Secondary | ICD-10-CM

## 2017-07-08 DIAGNOSIS — Z1231 Encounter for screening mammogram for malignant neoplasm of breast: Secondary | ICD-10-CM

## 2017-07-08 DIAGNOSIS — E559 Vitamin D deficiency, unspecified: Secondary | ICD-10-CM

## 2017-07-08 DIAGNOSIS — Z1239 Encounter for other screening for malignant neoplasm of breast: Secondary | ICD-10-CM

## 2017-07-08 NOTE — Patient Instructions (Addendum)
Told pt to use Zantac 75-150 BID for her heartburn    Behavior Modification Ideas for Weight Management  Weight management involves adopting a healthy lifestyle that includes a knowledge of nutrition and exercise, a positive attitude and the right kind of motivation. Internal motives such as better health, increased energy, self-esteem and personal control increase your chances of lifelong weight management success.  Remember to have realistic goals and think long-term success. Believe in yourself and you can do it. The following information will give you ideas to help you meet your goals.  Control Your Home Environment  Eat only while sitting down at the kitchen or dining room table. Do not eat while watching television, reading, cooking, talking on the phone, standing at the refrigerator or working on the computer. Keep tempting foods out of the house - don't buy them. Keep tempting foods out of sight. Have low-calorie foods ready to eat. Unless you are preparing a meal, stay out of the kitchen. Have healthy snacks at your disposal, such as small pieces of fruit, vegetables, canned fruit, pretzels, low-fat string cheese and nonfat cottage cheese.  Control Your Work Environment  Do not eat at [employer] or keep tempting snacks at your desk. If you get hungry between meals, plan healthy snacks and bring them with you to work. During your breaks, go for a walk instead of eating. If you work around food, plan in advance the one item you will eat at mealtime. Make it inconvenient to nibble on food by chewing gum, sugarless candy or drinking water or another low-calorie beverage. Do not work through meals. Skipping meals slows down metabolism and may result in overeating at the next meal. If food is available for special occasions, either pick the healthiest item, nibble on low-fat snacks brought from home, don't have anything offered, choose one option and have a small amount, or have only a  beverage.  Control Your Mealtime Environment  Serve your plate of food at the stove or kitchen counter. Do not put the serving dishes on the table. If you do put dishes on the table, remove them immediately when finished eating. Fill half of your plate with vegetables, a quarter with lean protein and a quarter with starch. Use smaller plates, bowls and glasses. A smaller portion will look large when it is in a little dish. Politely refuse second helpings. When fixing your plate, limit portions of food to one scoop/serving or less.   Daily Food Management  Replace eating with another activity that you will not associate with food. Wait 20 minutes before eating something you are craving. Drink a large glass of water or diet soda before eating. Always have a big glass or bottle of water to drink throughout the day. Avoid high-calorie add-ons such as cream with your coffee, butter, mayonnaise and salad dressings.  Shopping: Do not shop when hungry or tired. Shop from a list and avoid buying anything that is not on your list. If you must have tempting foods, buy individual-sized packages and try to find a lower-calorie alternative. Don't taste test in the store. Read food labels. Compare products to help you make the healthiest choices.  Preparation: Chew a piece of gum while cooking meals. Use a quarter teaspoon if you taste test your food. Try to only fix what you are going to eat, leaving yourself no chance for seconds. If you have prepared more food than you need, portion it into individual containers and freeze or refrigerate immediately. Don't snack  while cooking meals.  Eating: Eat slowly. Remember it takes about 20 minutes for your stomach to send a message to your brain that it is full. Don't let fake hunger make you think you need more. The ideal way to eat is to take a bite, put your utensil down, take a sip of water, cut your next bite, take a bit, put your utensil down and  so on. Do not cut your food all at one time. Cut only as needed. Take small bites and chew your food well. Stop eating for a minute or two at least once during a meal or snack. Take breaks to reflect and have conversation.  Cleanup and Leftovers: Label leftovers for a specific meal or snack. Freeze or refrigerate individual portions of leftovers. Do not clean up if you are still hungry.  Eating Out and Social Eating  Do not arrive hungry. Eat something light before the meal. Try to fill up on low-calorie foods, such as vegetables and fruit, and eat smaller portions of the high-calorie foods. Eat foods that you like, but choose small portions. If you want seconds, wait at least 20 minutes after you have eaten to see if you are actually hungry or if your eyes are bigger than your stomach. Limit alcoholic beverages. Try a soda water with a twist of lime. Do not skip other meals in the day to save room for the special event.  At Restaurants: Order  la carte rather than buffet style. Order some vegetables or a salad for an appetizer instead of eating bread. If you order a high-calorie dish, share it with someone. Try an after-dinner mint with your coffee. If you do have dessert, share it with two or more people. Don't overeat because you do not want to waste food. Ask for a doggie bag to take extra food home. Tell the server to put half of your entree in a to go bag before the meal is served to you. Ask for salad dressing, gravy or high-fat sauces on the side. Dip the tip of your fork in the dressing before each bite. If bread is served, ask for only one piece. Try it plain without butter or oil. At [employer] where oil and vinegar is served with bread, use only a small amount of oil and a lot of vinegar for dipping.  At a Friend's House: Offer to bring a dish, appetizer or dessert that is low in calories. Serve yourself small portions or tell the host that you only want a small  amount. Stand or sit away from the snack table. Stay away from the kitchen or stay busy if you are near the food. Limit your alcohol intake.  At [employer] and Cafeterias: Cover most of your plate with lettuce and/or vegetables. Use a salad plate instead of a dinner plate. After eating, clear away your dishes before having coffee or tea.  Entertaining at Home: Explore low-fat, low-cholesterol cookbooks. Use single-serving foods like chicken breasts or hamburger patties. Prepare low-calorie appetizers and desserts.   Holidays: Keep tempting foods out of sight. Decorate the house without using food. Have low-calorie beverages and foods on hand for guests. Allow yourself one planned treat a day. Don't skip meals to save up for the holiday feast. Eat regular, planned meals.   Exercise Well  Make exercise a priority and a planned activity in the day. If possible, walk the entire or part of the distance to work. Get an exercise buddy. Go for a walk with  a colleague during one of your breaks, go to the gym, run or take a walk with a friend, walk in the mall with a shopping companion. Park at the end of the parking lot and walk to the store or office entrance. Always take the stairs all of the way or at least part of the way to your floor. If you have a desk job, walk around the office frequently. Do leg lifts while sitting at your desk. Do something outside on the weekends like going for a hike or a bike ride.   Have a Healthy Attitude  Make health your weight management priority. Be realistic. Have a goal to achieve a healthier you, not necessarily the lowest weight or ideal weight based on calculations or tables. Focus on a healthy eating style, not on dieting. Dieting usually lasts for a short amount of time and rarely produces long-term success. Think long term. You are developing new healthy behaviors to follow next month, in a year and in a decade.    This information is for  educational purposes only and is not intended to replace the advice of your doctor or health care provider. We encourage you to discuss with your doctor any questions or concerns you may have.        Guidelines for Losing Weight   We want weight loss that will last so you should lose 1-2 pounds a week.  THAT IS IT! Please pick THREE things a month to change. Once it is a habit check off the item. Then pick another three items off the list to become habits.  If you are already doing a habit on the list GREAT!  Cross that item off!  Don't drink your calories. Ie, alcohol, soda, fruit juice, and sweet tea.   Drink more water. Drink a glass when you feel hungry or before each meal.   Eat breakfast - Complex carb and protein (likeDannon light and fit yogurt, oatmeal, fruit, eggs, [place] bacon).  Measure your cereal.  Eat no more than one cup a day. (ie Kashi)  Eat an apple a day.  Add a vegetable a day.  Try a new vegetable a month.  Use Pam! Stop using oil or butter to cook.  Don't finish your plate or use smaller plates.  Share your dessert.  Eat sugar free Jello for dessert or frozen grapes.  Don't eat 2-3 hours before bed.  Switch to whole wheat bread, pasta, and brown rice.  Make healthier choices when you eat out. No fries!  Pick baked chicken, NOT fried.  Don't forget to SLOW DOWN when you eat. It is not going anywhere.   Take the stairs.  Park far away in the parking lot  Lift soup cans (or weights) for 10 minutes while watching TV.  Walk at work for 10 minutes during break.  Walk outside 1 time a week with your friend, kids, dog, or significant other.  Start a walking group at church.  Walk the mall as much as you can tolerate.   Keep a food diary.  Weigh yourself daily.  Walk for 15 minutes 3 days per week.  Cook at home more often and eat out less. If life happens and you go back to old habits, it is okay.  Just start over. You can do it!  If  you experience chest pain, get short of breath, or tired during the exercise, please stop immediately and inform your doctor.    Before you even begin  to attack a weight-loss plan, it pays to remember this: You are not fat. You have fat. Losing weight isn't about blame or shame; it's simply another achievement to accomplish. Dieting is like any other skill-you have to buckle down and work at it. As long as you act in a smart, reasonable way, you'll ultimately get where you want to be. Here are some weight loss pearls for you.   1. It's Not a Diet. It's a Lifestyle Thinking of a diet as something you're on and suffering through only for the short term doesn't work. To shed weight and keep it off, you need to make permanent changes to the way you eat. It's OK to indulge occasionally, of course, but if you cut calories temporarily and then revert to your old way of eating, you'll gain back the weight quicker than you can say yo-yo. Use it to lose it. Research shows that one of the best predictors of long-term weight loss is how many pounds you drop in the first month. For that reason, nutritionists often suggest being stricter for the first two weeks of your new eating strategy to build momentum. Cut out added sugar and alcohol and avoid unrefined carbs. After that, figure out how you can reincorporate them in a way that's healthy and maintainable.  2. There's a Right Way to Exercise Working out burns calories and fat and boosts your metabolism by building muscle. But those trying to lose weight are notorious for overestimating the number of calories they burn and underestimating the amount they take in. Unfortunately, your system is biologically programmed to hold on to extra pounds and that means when you start exercising, your body senses the deficit and ramps up its hunger signals. If you're not diligent, you'll eat everything you burn and then some. Use it, to lose it. Cardio gets all the exercise  glory, but strength and interval training are the real heroes. They help you build lean muscle, which in turn increases your metabolism and calorie-burning ability 3. Don't Overreact to Mild Hunger Some people have a hard time losing weight because of hunger anxiety. To them, being hungry is bad-something to be avoided at all costs-so they carry snacks with them and eat when they don't need to. Others eat because they're stressed out or bored. While you never want to get to the point of being ravenous (that's when bingeing is likely to happen), a hunger pang, a craving, or the fact that it's 3:00 p.m. should not send you racing for the vending machine or obsessing about the energy bar in your purse. Ideally, you should put off eating until your stomach is growling and it's difficult to concentrate.  Use it to lose it. When you feel the urge to eat, use the HALT method. Ask yourself, Am I really hungry? Or am I angry or anxious, lonely or bored, or tired? If you're still not certain, try the apple test. If you're truly hungry, an apple should seem delicious; if it doesn't, something else is going on. Or you can try drinking water and making yourself busy, if you are still hungry try a healthy snack.  4. Not All Calories Are Created Equal The mechanics of weight loss are pretty simple: Take in fewer calories than you use for energy. But the kind of food you eat makes all the difference. Processed food that's high in saturated fat and refined starch or sugar can cause inflammation that disrupts the hormone signals that tell your brain you're  full. The result: You eat a lot more.  Use it to lose it. Clean up your diet. Swap in whole, unprocessed foods, including vegetables, lean protein, and healthy fats that will fill you up and give you the biggest nutritional bang for your calorie buck. In a few weeks, as your brain starts receiving regular hunger and fullness signals once again, you'll notice that you feel  less hungry overall and naturally start cutting back on the amount you eat.  5. Protein, Produce, and Plant-Based Fats Are Your Weight-Loss Trinity Here's why eating the three Ps regularly will help you drop pounds. Protein fills you up. You need it to build lean muscle, which keeps your metabolism humming so that you can torch more fat. People in a weight-loss program who ate double the recommended daily allowance for protein (about 110 grams for a 150-pound woman) lost 70 percent of their weight from fat, while people who ate the RDA lost only about 40 percent, one study found. Produce is packed with filling fiber. "It's very difficult to consume too many calories if you're eating a lot of vegetables. Example: Three cups of broccoli is a lot of food, yet only 93 calories. (Fruit is another story. It can be easy to overeat and can contain a lot of calories from sugar, so be sure to monitor your intake.) Plant-based fats like olive oil and those in avocados and nuts are healthy and extra satiating.  Use it to lose it. Aim to incorporate each of the three Ps into every meal and snack. People who eat protein throughout the day are able to keep weight off, according to a study in the [place] [employer] Clinical Nutrition. In addition to meat, poultry and seafood, good sources are beans, lentils, eggs, tofu, and yogurt. As for fat, keep portion sizes in check by measuring out salad dressing, oil, and nut butters (shoot for one to two tablespoons). Finally, eat veggies or a little fruit at every meal. People who did that consumed 308 fewer calories but didn't feel any hungrier than when they didn't eat more produce.  7. How You Eat Is As Important As What You Eat In order for your brain to register that you're full, you need to focus on what you're eating. Sit down whenever you eat, preferably at a table. Turn off the TV or computer, put down your phone, and look at your food. Smell it. Chew slowly, and don't  put another bite on your fork until you swallow. When women ate lunch this attentively, they consumed 30 percent less when snacking later than those who listened to an audiobook at lunchtime, according to a study in the [place] of Nutrition. 8. Weighing Yourself Really Works The scale provides the best evidence about whether your efforts are paying off. Seeing the numbers tick up or down or stagnate is motivation to keep going-or to rethink your approach. A 2015 study at [facility] found that daily weigh-ins helped people lose more weight, keep it off, and maintain that loss, even after two years. Use it to lose it. Step on the scale at the same time every day for the best results. If your weight shoots up several pounds from one weigh-in to the next, don't freak out. Eating a lot of salt the night before or having your period is the likely culprit. The number should return to normal in a day or two. It's a steady climb that you need to do something about. 9. Too Much  Stress and Too Little Sleep Are Your Enemies When you're tired and frazzled, your body cranks up the production of cortisol, the stress hormone that can cause carb cravings. Not getting enough sleep also boosts your levels of ghrelin, a hormone associated with hunger, while suppressing leptin, a hormone that signals fullness and satiety. People on a diet who slept only five and a half hours a night for two weeks lost 55 percent less fat and were hungrier than those who slept eight and a half hours, according to a study in the [place]. Use it to lose it. Prioritize sleep, aiming for seven hours or more a night, which research shows helps lower stress. And make sure you're getting quality zzz's. If a snoring spouse or a fidgety cat wakes you up frequently throughout the night, you may end up getting the equivalent of just four hours of sleep, according to a study from [facility]. Keep pets out  of the bedroom, and use a white-noise app to drown out snoring. 10. You Will Hit a plateau-And You Can Bust Through It As you slim down, your body releases much less leptin, the fullness hormone.  If you're not strength training, start right now. Building muscle can raise your metabolism to help you overcome a plateau. To keep your body challenged and burning calories, incorporate new moves and more intense intervals into your workouts or add another sweat session to your weekly routine. Alternatively, cut an extra 100 calories or so a day from your diet. Now that you've lost weight, your body simply doesn't need as much fuel.    Since food equals calories, in order to lose weight you must either eat fewer calories, exercise more to burn off calories with activity, or both. Food that is not used to fuel the body is stored as fat. A major component of losing weight is to make smarter food choices. Here's how:  1)   Limit non-nutritious foods, such as: Sugar, honey, syrups and candy Pastries, donuts, pies, cakes and cookies Soft drinks, sweetened juices and alcoholic beverages  2)  Cut down on high-fat foods by: - Choosing poultry, fish or lean red meat - Choosing low-fat cooking methods, such as baking, broiling, steaming, grilling and boiling - Using low-fat or non-fat dairy products - Using vinaigrette, herbs, lemon or fat-free salad dressings - Avoiding fatty meats, such as bacon, sausage, franks, ribs and luncheon meats - Avoiding high-fat snacks like nuts, chips and chocolate - Avoiding fried foods - Using less butter, margarine, oil and mayonnaise - Avoiding high-fat gravies, cream sauces and cream-based soups  3) Eat a variety of foods, including: - Fruit and vegetables that are raw, steamed or baked - Whole grains, breads, cereal, rice and pasta - Dairy products, such as low-fat or non-fat milk or yogurt, low-fat cottage cheese and low-fat cheese - Protein-rich foods like chicken,  [place], fish, lean meat and legumes, or beans  4) Change your eating habits by: - Eat three balanced meals a day to help control your hunger - Watch portion sizes and eat small servings of a variety of foods - Choose low-calorie snacks - Eat only when you are hungry and stop when you are satisfied - Eat slowly and try not to perform other tasks while eating - Find other activities to distract you from food, such as walking, taking up a hobby or being involved in the community - Include regular exercise in your daily routine ( minimum of 20 min  of moderate-intensity exercise at least 5 days/week)  - Find a support group, if necessary, for emotional support in your weight loss journey           Easy ways to cut 100 calories   1. Eat your eggs with hot sauce OR salsa instead of cheese.  Eggs are great for breakfast, but many people consider eggs and cheese to be BFFs. Instead of cheese-1 oz. of cheddar has 114 calories-top your eggs with hot sauce, which contains no calories and helps with satiety and metabolism. Salsa is also a great option!!  2. Top your toast, waffles or pancakes with fresh berries instead of jelly or syrup. Half a cup of berries-fresh, frozen or thawed-has about 40 calories, compared with 2 tbsp. of maple syrup or jelly, which both have about 100 calories. The berries will also give you a good punch of fiber, which helps keep you full and satisfied and won't spike blood sugar quickly like the jelly or syrup. 3. Swap the non-fat latte for black coffee with a splash of half-and-half. Contrary to its name, that non-fat latte has 130 calories and a startling 19g of carbohydrates per 16 oz. serving. Replacing that 'light' drinkable dessert with a black coffee with a splash of half-and-half saves you more than 100 calories per 16 oz. serving. 4. Sprinkle salads with freeze-dried raspberries instead of dried cranberries. If you want a sweet addition to your nutritious salad,  stay away from dried cranberries. They have a whopping 130 calories per  cup and 30g carbohydrates. Instead, sprinkle freeze-dried raspberries guilt-free and save more than 100 calories per  cup serving, adding 3g of belly-filling fiber. 5. Go for mustard in place of mayo on your sandwich. Mustard can add really nice flavor to any sandwich, and there are tons of varieties, from spicy to honey. A serving of mayo is 95 calories, versus 10 calories in a serving of mustard.  Or try an avocado mayo spread: You can find the recipe few click this link: https://www.californiaavocado.com/recipes/recipe-container/[place]-avocado-mayo 6. Choose a DIY salad dressing instead of the store-bought kind. Mix Dijon or whole grain mustard with low-fat Kefir or red wine vinegar and garlic. 7. Use hummus as a spread instead of a dip. Use hummus as a spread on a high-fiber cracker or tortilla with a sandwich and save on calories without sacrificing taste. 8. Pick just one salad "accessory." Salad isn't automatically a calorie winner. It's easy to over-accessorize with toppings. Instead of topping your salad with nuts, avocado and cranberries (all three will clock in at 313 calories), just pick one. The next day, choose a different accessory, which will also keep your salad interesting. You don't wear all your jewelry every day, right? 9. Ditch the white pasta in favor of spaghetti squash. One cup of cooked spaghetti squash has about 40 calories, compared with traditional spaghetti, which comes with more than 200. Spaghetti squash is also nutrient-dense. It's a good source of fiber and Vitamins A and C, and it can be eaten just like you would eat pasta-with a great tomato sauce and [place] meatballs or with pesto, tofu and spinach, for example. 10. Dress up your chili, soups and stews with non-fat [place] yogurt instead of sour cream. Just a 'dollop' of sour cream can set you back 115 calories and a whopping 12g of  fat-seven of which are of the artery-clogging variety. Added bonus: [place] yogurt is packed with muscle-building protein, calcium and B Vitamins. 11. Mash cauliflower instead of mashed potatoes.  One cup of traditional mashed potatoes-in all their creamy goodness-has more than 200 calories, compared to mashed cauliflower, which you can typically eat for less than 100 calories per 1 cup serving. Cauliflower is a great source of the antioxidant indole-3-carbinol (I3C), which may help reduce the risk of some cancers, like breast cancer. 12. Ditch the ice cream sundae in favor of a [place] yogurt parfait. Instead of a cup of ice cream or fro-yo for dessert, try 1 cup of nonfat Greek yogurt topped with fresh berries and a sprinkle of cacao nibs. Both toppings are packed with antioxidants, which can help reduce cellular inflammation and oxidative damage. And the comparison is a no-brainer: One cup of ice cream has about 275 calories; one cup of frozen yogurt has about 230; and a cup of Greek yogurt has just 130, plus twice the protein, so you're less likely to return to the freezer for a second helping. 13. Put olive oil in a spray container instead of using it directly from the bottle. Each tablespoon of olive oil is 120 calories and 15g of fat. Use a mister instead of pouring it straight into the pan or onto a salad. This allows for portion control and will save you more than 100 calories. 14. When baking, substitute canned pumpkin for butter or oil. Canned pumpkin-not pumpkin pie mix-is loaded with Vitamin A, which is important for skin and eye health, as well as immunity. And the comparisons are pretty crazy:  cup of canned pumpkin has about 40 calories, compared to butter or oil, which has more than 800 calories. Yes, 800 calories. Applesauce and mashed banana can also serve as good substitutions for butter or oil, usually in a 1:1 ratio. 15. Top casseroles with high-fiber cereal instead of  breadcrumbs. Breadcrumbs are typically made with white bread, while breakfast cereals contain 5-9g of fiber per serving. Not only will you save more than 150 calories per  cup serving, the swap will also keep you more full and you'll get a metabolism boost from the added fiber. 16. Snack on pistachios instead of macadamia nuts. Believe it or not, you get the same amount of calories from 35 pistachios (100 calories) as you would from only five macadamia nuts. 17. Chow down on kale chips rather than potato chips. This is my favorite 'don't knock it 'till you try it' swap. Kale chips are so easy to make at home, and you can spice them up with a little grated parmesan or chili powder. Plus, they're a mere fraction of the calories of potato chips, but with the same crunch factor we crave so often. 18. Add seltzer and some fruit slices to your cocktail instead of soda or fruit juice. One cup of soda or fruit juice can pack on as much as 140 calories. Instead, use seltzer and fruit slices. The fruit provides valuable phytochemicals, such as flavonoids and anthocyanins, which help to combat cancer and stave off the aging process.

## 2017-07-08 NOTE — Progress Notes (Signed)
Impression and Recommendations:    1. S/P gastric bypass- 2007- in [place]   2. Other iron deficiency anemia   3. Obesity, Class III, BMI 40-49.9 (morbid obesity) ([place])   4. Vitamin D deficiency   5. Intractable headache, unspecified chronicity pattern, unspecified headache type   6. Abdominal discomfort in right side of abd - only since after colonoscopy   7. Gastroesophageal reflux disease, esophagitis presence not specified   8. Screening for breast cancer   9. Atypical nevi- R suprapubic region     1. S/p gastric bypass 2007- check anemia panel labs.  2. Screening for breast CA- referral given for mammogram.  3. Obesity- recommend pt to lose wt.  4. Vitamin D deficiency- stable at this time. Tolerating supplements well without complication. Continue your supplements as listed below. Check labs.  5. Intractable HA- pt reports significant improvement to her HA and prevention of migraines after taking magnesium powders. Continue as tolerated. Check magnesium labs.  6. Other iron deficiency anemia- s/p gastric bypass. Sx stable and pt is compliant and tolerating supplements well. Check anemia panel labs.   7. Abdominal discomfort in R side of abd- - improved from last OV -continue watching what you eat and reviewing what foods upset your stomach.   8. GERD- start OTC zantac. -watch foods that you eat.  9. Atypical nevi- pt complains of an irritating area of her suprapubic abdomen that is irritated and "growing". Believe this is due to her shaving the area. Pt requests having this area removed. Will do this at later visit in near future.   Orders Placed This Encounter  Procedures  . MM Digital Screening  . Magnesium  . Phosphorus  . Iron Binding Cap (TIBC)([facility])  . Comprehensive metabolic panel  . CBC with Differential/Platelet  . VITAMIN D 25 Hydroxy (Vit-D Deficiency, Fractures)    Gross side effects, risk and benefits, and alternatives of medications  and treatment plan in general discussed with patient.  Patient is aware that all medications have potential side effects and we are unable to predict every side effect or drug-drug interaction that may occur.   Patient will call with any questions prior to using medication if they have concerns.  Expresses verbal understanding and consents to current therapy and treatment regimen.  No barriers to understanding were identified.  Red flag symptoms and signs discussed in detail.  Patient expressed understanding regarding what to do in case of emergency\urgent symptoms  Please see AVS handed out to patient at the end of our visit for further patient instructions/ counseling done pertaining to today's office visit.  Return in about 4 months (around 11/07/2017), or pt will f/up soon for labwrk review and remove skin lesion, for F-up of current med issues as d/c pt.    Note: This note was prepared with assistance of Dragon voice recognition software. Occasional wrong-word or sound-a-like substitutions may have occurred due to the inherent limitations of voice recognition software.  This document serves as a record of services personally performed by Mellody Dance, DO. It was created on her behalf by Mayer Masker, a trained medical scribe. The creation of this record is based on the scribe's personal observations and the provider's statements to them.   I have reviewed the above medical documentation for accuracy and completeness and I concur.  Mellody Dance 07/08/17 6:11 PM  --------------------------------------------------------------------------------------------------------------------------------------------------------------------------------------------------------------   Subjective:     HPI: Margaret Schultz is a 57 y.o. female who presents to [facility] today for follow up of abdominal pain and other issues as listed below.  Abdominal pain From last OV  05-09-17, she complained of abdominal pain that arises after she eats "heavy" foods like red meat s/p colonoscopy.  She has not been drinking milk, eating black beans, or rice (this upset her stomach), and her symptoms have improved after. She has been drinking a keto powdered creamer to her coffee. She has been eating bread. She has been reading about foods that cause aggravation, and has been avoiding these types of foods. She now has daily epigastric heartburn. She has been taking an unknown supplemental in whole foods for this. She denies abdominal pain, cramping etc after ceasing to eat these kinds of foods.   Iron deficiency She is taking her iron supplements but complains of the taste. She denies side effect to the supplements.    Migraines: She has been drinking magnesium powders with water (similar to alka seltzer) every morning which significant relief to her HA and prevention. She has a h/o chronic migraine and has seen several specialists for this without relief.   Skin:  She complains of an area of her skin that is growing bigger over time that is on the R side of her suprapubic abdomen. She has had this for a long period of time. Pt prefers to get this lesion removed in near future.    Wt Readings from Last 3 Encounters:  07/08/17 242 lb 11.2 oz (110.1 kg)  05/09/17 241 lb 3.2 oz (109.4 kg)  03/14/17 240 lb (108.9 kg)   BP Readings from Last 3 Encounters:  07/08/17 130/82  05/09/17 127/77  03/14/17 (!) 108/50   Pulse Readings from Last 3 Encounters:  07/08/17 70  05/09/17 88  03/14/17 (!) 52   BMI Readings from Last 3 Encounters:  07/08/17 40.39 kg/m  05/09/17 40.14 kg/m  03/14/17 39.94 kg/m     Patient Care Team    Relationship Specialty Notifications Start End  Mellody Dance, DO PCP - General Family Medicine  09/21/15      Patient Active Problem List   Diagnosis Date Noted  . Gastroesophageal reflux disease 07/08/2017  . Atypical nevi 07/08/2017  .  Medical non-compliance 05/09/2017  . Abdominal discomfort in right side of abd - only since after colonoscopy 05/09/2017  . Vitamin D deficiency 05/09/2017  . S/P gastric bypass- 2007- in [place] 04/18/2016  . Intractable headache 11/14/2015  . Acute reaction to stress 11/14/2015  . Headache, migraine 09/21/2015  . Obesity, Class III, BMI 40-49.9 (morbid obesity) ([place]) 09/21/2015  . Myalgia and myositis 09/21/2015  . Insomnia due to stress 09/21/2015  . Anxiety 09/21/2015  . Anemia, iron deficiency 12/15/1990    Past Medical history, Surgical history, Family history, Social history, Allergies and Medications have been entered into the medical record, reviewed and changed as needed.    Current Meds  Medication Sig  . baclofen (LIORESAL) 10 MG tablet Take 1 tablet by mouth daily as needed.  . busPIRone (BUSPAR) 15 MG tablet Take 1 tablet (15 mg total) by mouth 3 (three) times daily as needed.  . ferrous sulfate 325 (65 FE) MG EC tablet Take 1 tablet (325 mg total) by mouth 3 (three) times daily with meals.  . nabumetone (RELAFEN) 500 MG tablet Take 1 tablet by mouth daily as needed.  . Topiramate ER (QUDEXY XR) 200 MG CS24 sprinkle capsule Take 1 capsule daily by mouth.  . venlafaxine XR (EFFEXOR-XR) 150 MG 24  hr capsule Take two tablets once daily.  . Vitamin D, Ergocalciferol, (DRISDOL) 50000 units CAPS capsule Take 1 capsule (50,000 Units total) by mouth every 7 (seven) days.  Marland Kitchen zolpidem (AMBIEN CR) 12.5 MG CR tablet Take 1 tablet (12.5 mg total) by mouth at bedtime as needed for sleep.    Allergies:  Allergies  Allergen Reactions  . Iodine Rash     Review of Systems:  A fourteen system review of systems was performed and found to be positive as per HPI.   Objective:   Blood pressure 130/82, pulse 70, height 5\' 5"  (1.651 m), weight 242 lb 11.2 oz (110.1 kg), SpO2 97 %. Body mass index is 40.39 kg/m. General:  Well Developed, well nourished, appropriate for stated age.    Neuro:  Alert and oriented,  extra-ocular muscles intact  HEENT:  Normocephalic, atraumatic, neck supple, no carotid bruits appreciated  Skin:  no gross rash, warm, pink. There is a slightly oval, raised, slightly hyperkeratotic, hyperpigmented lesion, R suprapubic region that is approximately 0.42mm x 0.4 mm,  Cardiac:  RRR, S1 S2 Respiratory:  ECTA B/L and A/P, Not using accessory muscles, speaking in full sentences- unlabored. Vascular:  Ext warm, no cyanosis apprec.; cap RF less 2 sec. Psych:  No HI/SI, judgement and insight good, Euthymic mood. Full Affect.

## 2017-07-09 LAB — COMPREHENSIVE METABOLIC PANEL
A/G RATIO: 1.7 (ref 1.2–2.2)
ALBUMIN: 4 g/dL (ref 3.5–5.5)
ALK PHOS: 116 IU/L (ref 39–117)
ALT: 18 IU/L (ref 0–32)
AST: 20 IU/L (ref 0–40)
BILIRUBIN TOTAL: 0.3 mg/dL (ref 0.0–1.2)
BUN / CREAT RATIO: 28 — AB (ref 9–23)
BUN: 22 mg/dL (ref 6–24)
CO2: 23 mmol/L (ref 20–29)
Calcium: 9.6 mg/dL (ref 8.7–10.2)
Chloride: 109 mmol/L — ABNORMAL HIGH (ref 96–106)
Creatinine, Ser: 0.79 mg/dL (ref 0.57–1.00)
GFR calc Af Amer: 97 mL/min/{1.73_m2} (ref >59)
GFR calc non Af Amer: 84 mL/min/{1.73_m2} (ref >59)
GLOBULIN, TOTAL: 2.4 g/dL (ref 1.5–4.5)
Glucose: 99 mg/dL (ref 65–99)
Potassium: 4.5 mmol/L (ref 3.5–5.2)
SODIUM: 142 mmol/L (ref 134–144)
Total Protein: 6.4 g/dL (ref 6.0–8.5)

## 2017-07-09 LAB — IRON AND TIBC
Iron Saturation: 8 % — CL (ref 15–55)
Iron: 38 ug/dL (ref 27–159)
TIBC: 472 ug/dL — AB (ref 250–450)
UIBC: 434 ug/dL — AB (ref 131–425)

## 2017-07-09 LAB — CBC WITH DIFFERENTIAL/PLATELET
Basophils Absolute: 0 10*3/uL (ref 0.0–0.2)
Basos: 1 %
EOS (ABSOLUTE): 0.1 10*3/uL (ref 0.0–0.4)
Eos: 3 %
HEMATOCRIT: 32.4 % — AB (ref 34.0–46.6)
Hemoglobin: 9.7 g/dL — ABNORMAL LOW (ref 11.1–15.9)
Immature Grans (Abs): 0 10*3/uL (ref 0.0–0.1)
Immature Granulocytes: 0 %
LYMPHS ABS: 1.7 10*3/uL (ref 0.7–3.1)
Lymphs: 34 %
MCH: 21.6 pg — ABNORMAL LOW (ref 26.6–33.0)
MCHC: 29.9 g/dL — AB (ref 31.5–35.7)
MCV: 72 fL — AB (ref 79–97)
MONOS ABS: 0.2 10*3/uL (ref 0.1–0.9)
Monocytes: 5 %
Neutrophils Absolute: 2.9 10*3/uL (ref 1.4–7.0)
Neutrophils: 57 %
Platelets: 250 10*3/uL (ref 150–379)
RBC: 4.5 x10E6/uL (ref 3.77–5.28)
RDW: 17 % — AB (ref 12.3–15.4)
WBC: 5 10*3/uL (ref 3.4–10.8)

## 2017-07-09 LAB — PHOSPHORUS: PHOSPHORUS: 2.8 mg/dL (ref 2.5–4.5)

## 2017-07-09 LAB — VITAMIN D 25 HYDROXY (VIT D DEFICIENCY, FRACTURES): VIT D 25 HYDROXY: 15 ng/mL — AB (ref 30.0–100.0)

## 2017-07-09 LAB — MAGNESIUM: MAGNESIUM: 2.2 mg/dL (ref 1.6–2.3)

## 2017-07-16 ENCOUNTER — Other Ambulatory Visit: Payer: Self-pay

## 2017-07-16 NOTE — Progress Notes (Signed)
Pt RX'd vitamin D 50,000 units #12 with 10 refills on 05/09/17.  No additional RX sent to pharmacy.  Charyl Bigger, CMA

## 2017-07-30 ENCOUNTER — Telehealth: Payer: Self-pay

## 2017-07-30 NOTE — Telephone Encounter (Signed)
Received Epic notification that pt has not read MyChart message regarding results.    Recent lab results   From Fonnie Mu, CMA To Christelle Igoe Sent 07/16/2017 10:46 AM  Margaret Schultz.   Dr. Raliegh Scarlet asked that I inform you that your recent blood work revealed that you are still very anemic. You need to be taking 325mg  of iron 3 times daily. You MUST be very consistent with taking the iron tablets because, if next time we check your labs and you are still anemic again, then you will need to be seen by Hematology for further evaluation and treatment, as you may need iron infusions.   With the vitamin D, you are are supposed to be taking over the counter Vitamin D3 5000 IUs every day in addition to the once weekly dose of vitamin D. Dr Raliegh Scarlet is sending in a prescription for 50k IU's of vitamin D. You will need to take one tablet once weekly and get this refilled every 12 weeks.   We will need to recheck both of these levels in 3-4 months.   Wishing you well,  Kenney Houseman, CMA for  Mina Marble, NP      [address]   MyChart User Last Read On  Margaret Schultz Not Read     Pt informed of results.  Pt expressed understanding and is agreeable.  Charyl Bigger, CMA

## 2017-07-31 IMAGING — MG 2D DIGITAL SCREENING BILATERAL MAMMOGRAM WITH CAD AND ADJUNCT TO
8 of 12 series · 8 of 28 positions shown · non-contrast
Comparison: None.

ACR Breast Density Category a: The breast tissue is almost entirely
fatty.

CLINICAL DATA: Screening.

EXAM:
2D DIGITAL SCREENING BILATERAL MAMMOGRAM WITH CAD AND ADJUNCT TOMO

[R MLO synth-2D]
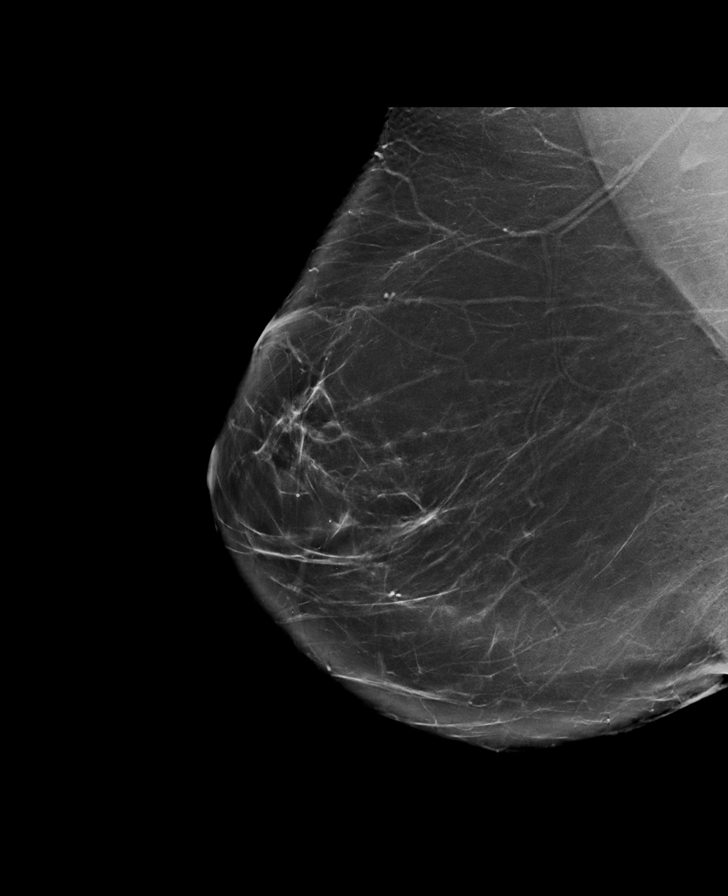

[R CC]
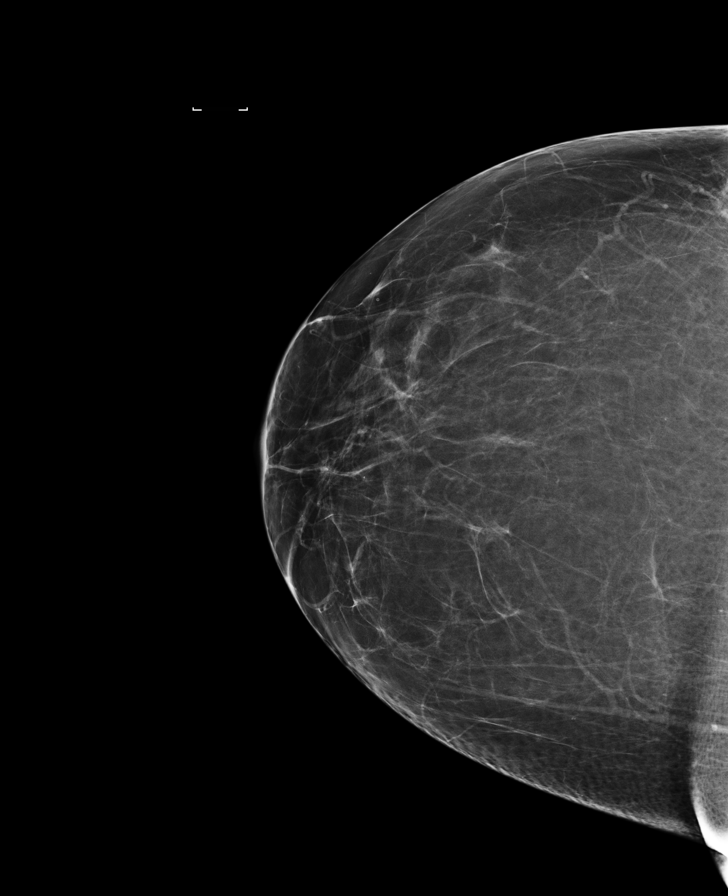

[L MLO]
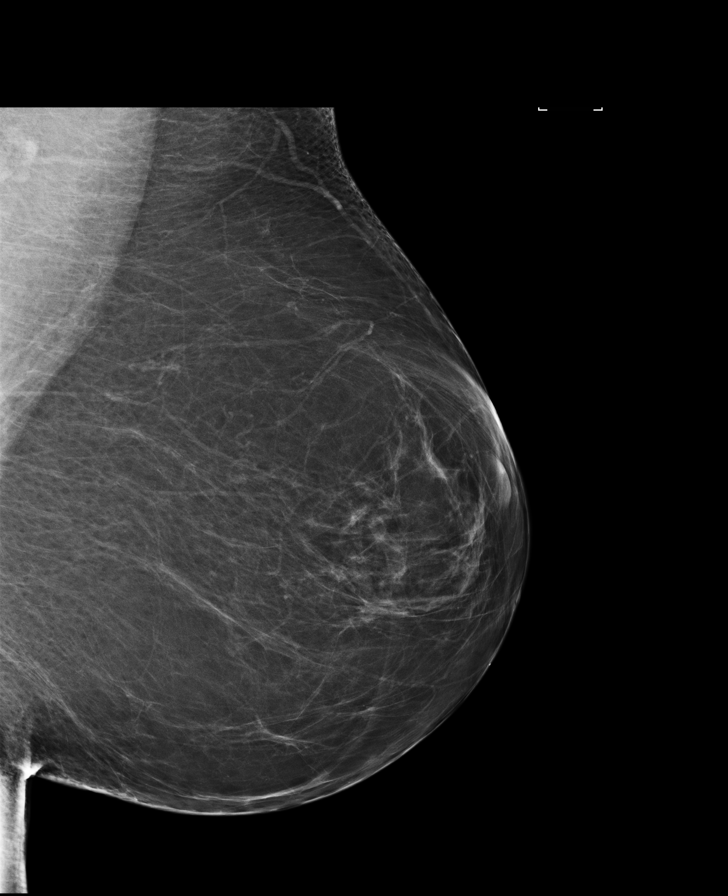

[L CC]
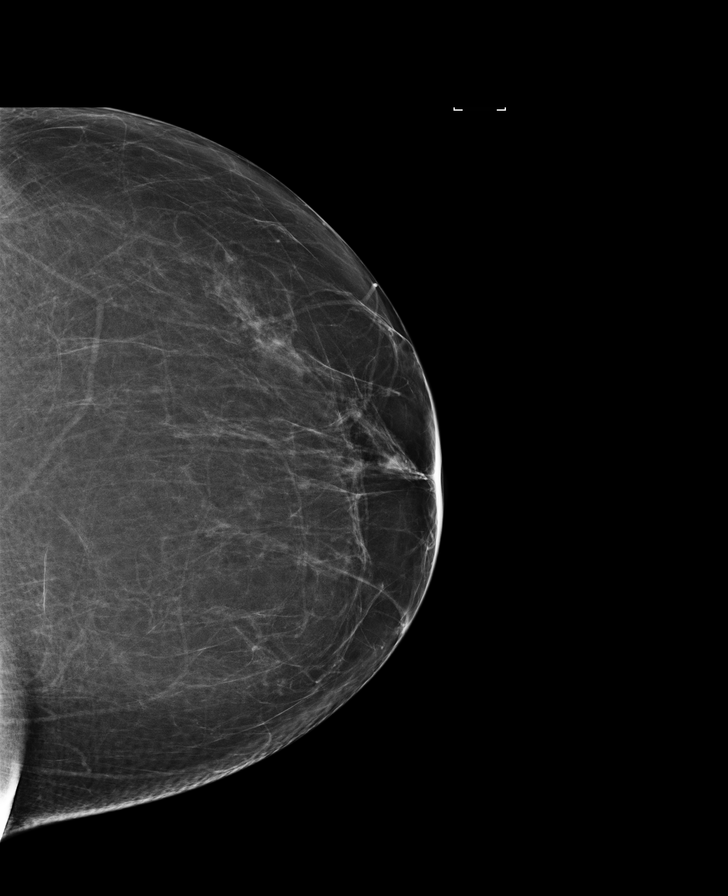

[L MLO synth-2D]
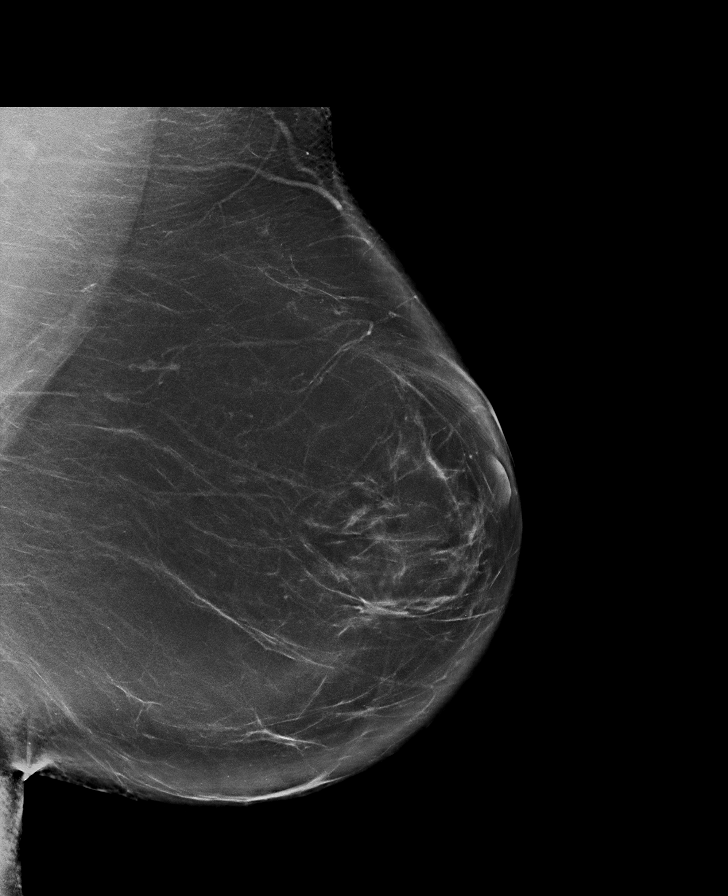

[R MLO]
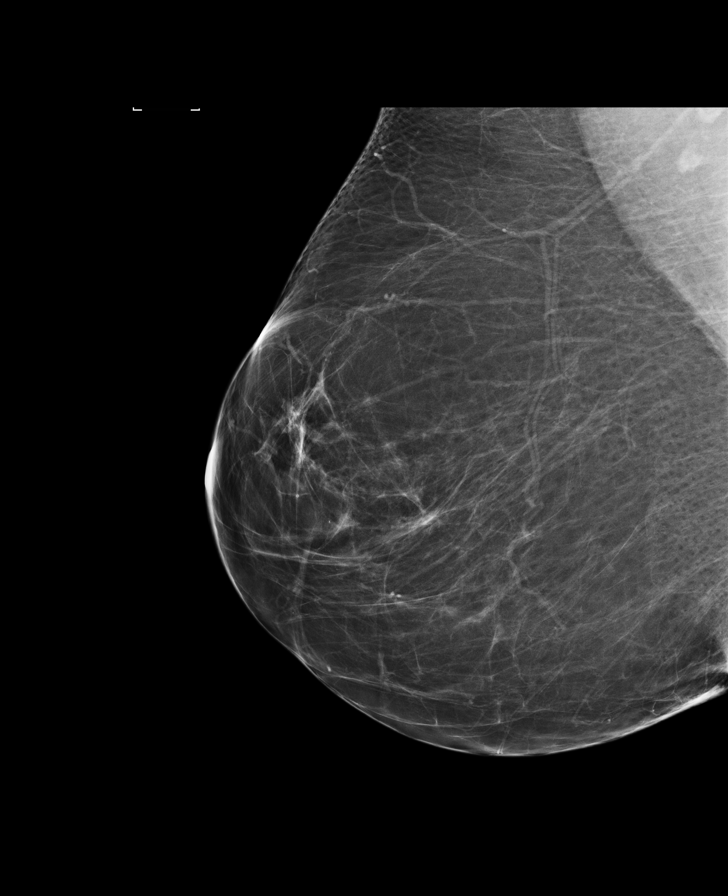

[R CC synth-2D]
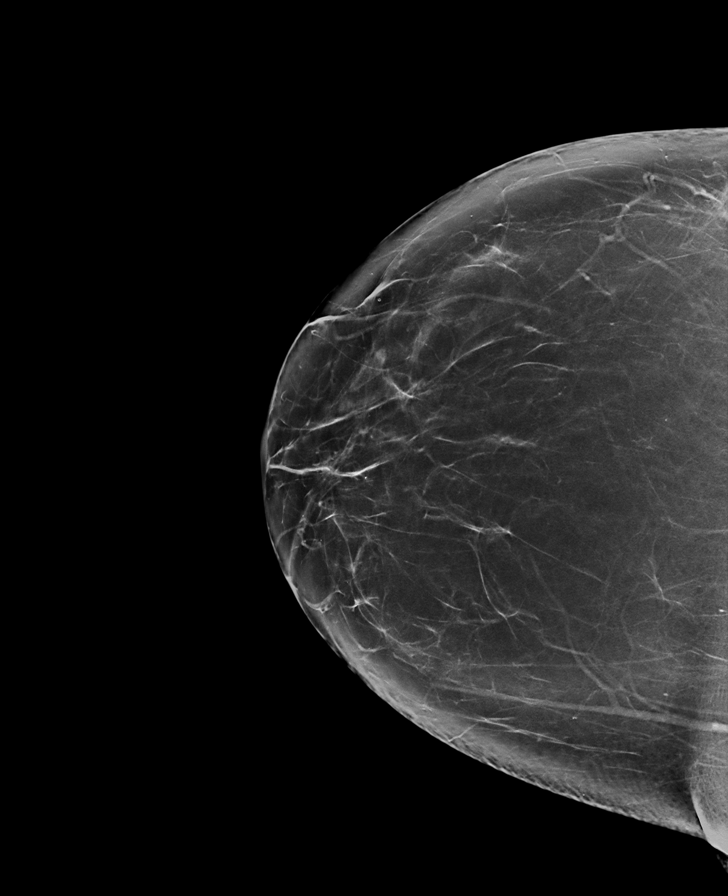

[L CC synth-2D]
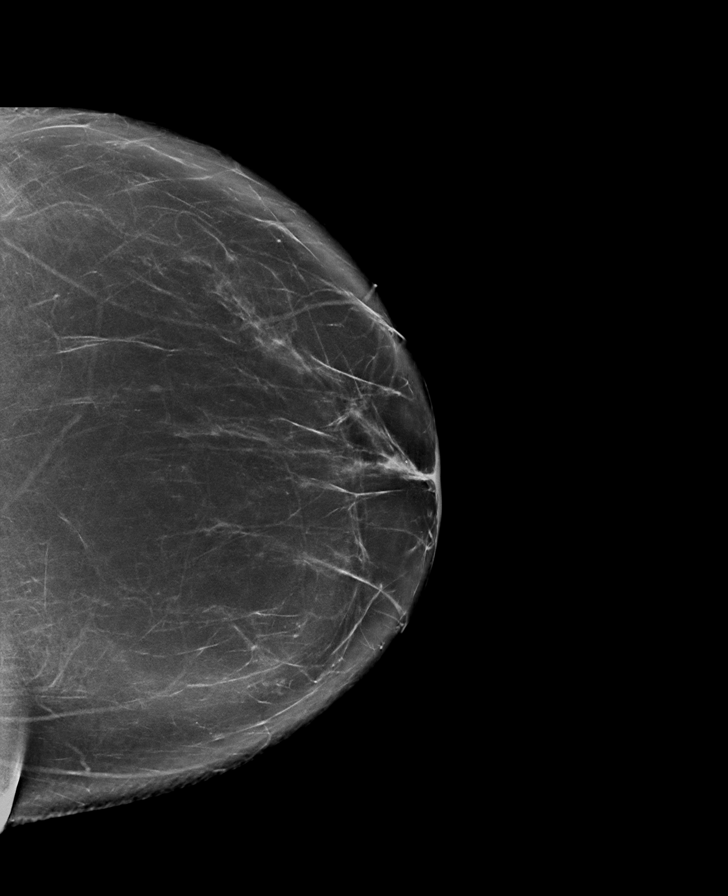

[8 of 28 positions shown; findings below may reference images not displayed]

FINDINGS: There are no findings suspicious for malignancy. Images were
processed with CAD.
IMPRESSION: No mammographic evidence of malignancy. A result letter of this
screening mammogram will be mailed directly to the patient.

RECOMMENDATION:
Screening mammogram in one year. (Code:[id number])

BI-RADS CATEGORY  1: Negative.

## 2017-11-06 ENCOUNTER — Encounter: Payer: Self-pay | Admitting: Family Medicine

## 2017-11-06 ENCOUNTER — Ambulatory Visit (INDEPENDENT_AMBULATORY_CARE_PROVIDER_SITE_OTHER): Payer: 59 | Admitting: Family Medicine

## 2017-11-06 ENCOUNTER — Other Ambulatory Visit (HOSPITAL_COMMUNITY)
Admission: RE | Admit: 2017-11-06 | Discharge: 2017-11-06 | Disposition: A | Discharge status: Home / Self Care | Payer: 59 | Source: Ambulatory Visit | Attending: Family Medicine | Admitting: Family Medicine

## 2017-11-06 VITALS — BP 132/85 | HR 71 | Ht 65.0 in | Wt 242.0 lb

## 2017-11-06 DIAGNOSIS — F5102 Adjustment insomnia: Secondary | ICD-10-CM

## 2017-11-06 DIAGNOSIS — D229 Melanocytic nevi, unspecified: Secondary | ICD-10-CM

## 2017-11-06 DIAGNOSIS — E559 Vitamin D deficiency, unspecified: Secondary | ICD-10-CM

## 2017-11-06 DIAGNOSIS — F419 Anxiety disorder, unspecified: Secondary | ICD-10-CM

## 2017-11-06 DIAGNOSIS — D649 Anemia, unspecified: Secondary | ICD-10-CM | POA: Insufficient documentation

## 2017-11-06 DIAGNOSIS — Z9884 Bariatric surgery status: Secondary | ICD-10-CM

## 2017-11-06 DIAGNOSIS — F430 Acute stress reaction: Secondary | ICD-10-CM

## 2017-11-06 DIAGNOSIS — L821 Other seborrheic keratosis: Secondary | ICD-10-CM | POA: Diagnosis not present

## 2017-11-06 DIAGNOSIS — D508 Other iron deficiency anemias: Secondary | ICD-10-CM

## 2017-11-06 MED ORDER — BUSPIRONE HCL 15 MG PO TABS: 15.0000 mg | ORAL_TABLET | Freq: Three times a day (TID) | ORAL | 1 refills | Status: AC | PRN

## 2017-11-06 MED ORDER — ZOLPIDEM TARTRATE ER 12.5 MG PO TBCR: 12.5000 mg | EXTENDED_RELEASE_TABLET | Freq: Every evening | ORAL | 1 refills | Status: AC | PRN

## 2017-11-06 MED ORDER — VENLAFAXINE HCL ER 150 MG PO CP24: ORAL_CAPSULE | ORAL | 1 refills | Status: DC

## 2017-11-06 NOTE — Progress Notes (Signed)
Impression and Recommendations:    1. Atypical nevi- r suprapubic region   2. Acute reaction to stress   3. Anxiety   4. Insomnia due to stress   5. S/P gastric bypass- 2007- in [place]   6. Other iron deficiency anemia   7. Vitamin D deficiency     1. Removal of Atypical Nevi  - Indication: Atypical Nevi - Changing mole in suprapubic region on the right that, per patient, at times gets caught while shaving, occasionally bleeds, getting irritated and painful.  Medical necessity statement:  On physical examination, irregular skin lesion present with atypical features worrisome for pre-cancerous/ cancerous condition.    Consent:  Discussed benefits and risks of procedure and verbal consent obtained  Procedure:  Patient was prepped in sterile fashion for the procedure.  Less than 1cc injection of 2% lido w epi used for anesthesia. In sterile fashion, shave biopsy performed.  Hemostasis obtained with use of silver nitrate sticks and pressure to area.  Pt tolerated well. No complications  Blood Loss:  Minimal, less than 1 cc  Post-Care Instructions:   Proper wound care discussed with patient.  If wound does not continue improve as expected, patient told to notify us immediately  - Keep the area covered with a band-aid in the mornings.  Shower normally with soap and water, but do not scrub the area.  2. Mood - Continue medications as prescribed.  - Advised patient to speak to counselor at work to find additional ways to deal with her work-related stress.  - In the future, patient wishes to start reducing dosage of the pill.  - Counseled patient to start exercising daily, visiting a counselor, eating more healthfully, praying, being mindful, and otherwise attending to her mood in other ways.  3. Follow-Up - Return for regularly scheduled chronic follow-up in 4-6 months.  - Lab work will be drawn to evaluate her iron and Vitamin D.  Orders Placed This Encounter  Procedures    . Iron Binding Cap (TIBC)([facility])  . CBC with Differential/Platelet  . VITAMIN D 25 Hydroxy (Vit-D Deficiency, Fractures)    Meds ordered this encounter  Medications  . busPIRone (BUSPAR) 15 MG tablet    Sig: Take 1 tablet (15 mg total) by mouth 3 (three) times daily as needed.    Dispense:  270 tablet    Refill:  1  . venlafaxine XR (EFFEXOR-XR) 150 MG 24 hr capsule    Sig: Take two tablets once daily.    Dispense:  180 capsule    Refill:  1  . zolpidem (AMBIEN CR) 12.5 MG CR tablet    Sig: Take 1 tablet (12.5 mg total) by mouth at bedtime as needed for sleep.    Dispense:  90 tablet    Refill:  1    Gross side effects, risk and benefits, and alternatives of medications and treatment plan in general discussed with patient.  Patient is aware that all medications have potential side effects and we are unable to predict every side effect or drug-drug interaction that may occur.   Patient will call with any questions prior to using medication if they have concerns.  Expresses verbal understanding and consents to current therapy and treatment regimen.  No barriers to understanding were identified.  Red flag symptoms and signs discussed in detail.  Patient expressed understanding regarding what to do in case of emergency\urgent symptoms  Please see AVS handed out to patient at the end of our visit  for further patient instructions/ counseling done pertaining to today's office visit.   Return for 4-6 months for management of active chronic problems.    Note: This note was prepared with assistance of Dragon voice recognition software. Occasional wrong-word or sound-a-like substitutions may have occurred due to the inherent limitations of voice recognition software.   This document serves as a record of services personally performed by Mellody Dance, DO. It was created on her behalf by Toni Amend, a trained medical scribe. The creation of this record is based on the  scribe's personal observations and the provider's statements to them.   I have reviewed the above medical documentation for accuracy and completeness and I concur.  Mellody Dance 11/27/17 10:20 AM   ------------------------------------------------------------------------------------------------------------------------    Subjective:     HPI: Margaret Schultz is a 57 y.o. female who presents to [place] at [facility] today for issues as discussed below.  Patient notes she is going to [place] next week for work.  Patient continues taking her iron daily, 90% of the time.  Patient denies side-effects of constipation or nausea.  She desires to start taking three iron pills at a time.  She takes her 50k units of Vitamin D once weekly on Saturday.  Atypical Skin Lesion Skin lesion started as a little spot and grew over the course of five or six years.  The area is not itchy, states it's just bothering her.  Notes that it's getting caught when she dresses and shaves and she indicates that sometimes she wants to just rip it off.  Mood Patient notes that sometimes she feels depressed.  "It's like when you try to do the best you can do ... And you see that nothing gets better."  Comments that she feels like she receives a lot of negative feedback at work, feeling manipulated and undermined at work.  Notes it's related to work.  Patient is not looking for a new company yet.    Patient is trying to ignore the negativity of her manager and hoping to find new work.  To make herself feel better, she goes home and goes to sleep early.   Wt Readings from Last 3 Encounters:  11/06/17 242 lb (109.8 kg)  07/08/17 242 lb 11.2 oz (110.1 kg)  05/09/17 241 lb 3.2 oz (109.4 kg)   BP Readings from Last 3 Encounters:  11/06/17 132/85  07/08/17 130/82  05/09/17 127/77   Pulse Readings from Last 3 Encounters:  11/06/17 71  07/08/17 70  05/09/17 88   BMI Readings from Last  3 Encounters:  11/06/17 40.27 kg/m  07/08/17 40.39 kg/m  05/09/17 40.14 kg/m     Patient Care Team    Relationship Specialty Notifications Start End  Mellody Dance, DO PCP - General Family Medicine  09/21/15      Patient Active Problem List   Diagnosis Date Noted  . Absolute anemia 11/06/2017  . Gastroesophageal reflux disease 07/08/2017  . Atypical nevi 07/08/2017  . Medical non-compliance 05/09/2017  . Abdominal discomfort in right side of abd - only since after colonoscopy 05/09/2017  . Vitamin D deficiency 05/09/2017  . S/P gastric bypass- 2007- in [place] 04/18/2016  . Intractable headache 11/14/2015  . Acute reaction to stress 11/14/2015  . Headache, migraine 09/21/2015  . Obesity, Class III, BMI 40-49.9 (morbid obesity) ([place]) 09/21/2015  . Myalgia and myositis 09/21/2015  . Insomnia due to stress 09/21/2015  . Anxiety 09/21/2015  . Anemia, iron deficiency 12/15/1990  Past Medical history, Surgical history, Family history, Social history, Allergies and Medications have been entered into the medical record, reviewed and changed as needed.    Current Meds  Medication Sig  . busPIRone (BUSPAR) 15 MG tablet Take 1 tablet (15 mg total) by mouth 3 (three) times daily as needed.  . ferrous sulfate 325 (65 FE) MG EC tablet Take 1 tablet (325 mg total) by mouth 3 (three) times daily with meals.  . nabumetone (RELAFEN) 500 MG tablet Take 1 tablet by mouth daily as needed.  . Topiramate ER (QUDEXY XR) 200 MG CS24 sprinkle capsule Take 1 capsule daily by mouth.  . venlafaxine XR (EFFEXOR-XR) 150 MG 24 hr capsule Take two tablets once daily.  . Vitamin D, Ergocalciferol, (DRISDOL) 50000 units CAPS capsule Take 1 capsule (50,000 Units total) by mouth every 7 (seven) days.  Marland Kitchen zolpidem (AMBIEN CR) 12.5 MG CR tablet Take 1 tablet (12.5 mg total) by mouth at bedtime as needed for sleep.  . [DISCONTINUED] busPIRone (BUSPAR) 15 MG tablet Take 1 tablet (15 mg total) by mouth 3  (three) times daily as needed.  . [DISCONTINUED] venlafaxine XR (EFFEXOR-XR) 150 MG 24 hr capsule Take two tablets once daily.  . [DISCONTINUED] zolpidem (AMBIEN CR) 12.5 MG CR tablet Take 1 tablet (12.5 mg total) by mouth at bedtime as needed for sleep.    Allergies:  Allergies  Allergen Reactions  . Iodine Rash     Review of Systems:  A fourteen system review of systems was performed and found to be positive as per HPI.   Objective:   Blood pressure 132/85, pulse 71, height 5\' 5"  (1.651 m), weight 242 lb (109.8 kg), SpO2 98 %. Body mass index is 40.27 kg/m. General:  Well Developed, well nourished, appropriate for stated age.  Neuro:  Alert and oriented,  extra-ocular muscles intact  HEENT:  Normocephalic, atraumatic, neck supple, no carotid bruits appreciated  Skin: 1.2 cm hyperkeratotic raised skin lesion on right suprapubic region, with irregular borders.  No gross rash, warm, pink. Cardiac:  RRR, S1 S2 Respiratory:  ECTA B/L and A/P, Not using accessory muscles, speaking in full sentences- unlabored. Vascular:  Ext warm, no cyanosis apprec.; cap RF less 2 sec. Psych:  No HI/SI, judgement and insight good, Euthymic mood. Full Affect.

## 2017-11-06 NOTE — Patient Instructions (Signed)
-Also we need to transition your brain into thinking more positively.  These tasks below are some things I want you to do every day 1)  write 3 new things that you are grateful for every day for 21 days  2)  exercise daily- walk for 15 minutes twice a day every day 3)  you are going to journal every day about one positive experience that you had 4)  meditate every day.  You can go on YouTube and look for 15-minute relaxation meditation or what ever.  But we need to make sure that you are in the moment and relaxing and deep breathing every day 5)  Write 1 positive email every day to praise someone in your life     - If you have insomnia or difficulty sleeping, this information is for you:  - Avoid caffeinated beverages after lunch,  no alcoholic beverages,  no eating within 2-3 hours of lying down,  avoid exposure to blue light before bed,  avoid daytime naps, and  needs to maintain a regular sleep schedule- go to sleep and wake up around the same time every night.   - Resolve concerns or worries before entering bedroom:  Discussed relaxation techniques with patient and to keep a journal to write down fears\ worries.  I suggested seeing a counselor for CBT.   - Recommend patient meditate or do deep breathing exercises to help relax.   Incorporate the use of white noise machines or listen to "sleep meditation music", or recordings of guided meditations for sleep from YouTube which are free, such as  "guided meditation for detachment from over thinking"  by Mayford Knife.         What is Chronic Stress Syndrome, Symptoms & Ways to Deal With it   What is Chronic Stress Syndrome?  Chronic Stress Syndrome is something which can now be called as a medical condition due to the amount of stress an individual is going through these days. Chronic Stress Syndrome causes the body and mind to shutdown and the person has no control over himself or herself. Due to the demands of modern day life  and the hardship throughout day and night takes its toll over a period of time and the body and brain starts demanding rest and a break. This leads to certain symptoms where your performance level starts to dip at work, you become irritable both at work and at home, you may stop enjoying activities you previously liked, you may become depressed, you may get angry for even small things. Chronic Stress Syndrome can significantly impact your quality life. Thus it is important understand the symptoms of Chronic Stress Syndrome and react accordingly in order to cope up with it.  It is important to note here that a balanced work-home equation should be drawn to cut down symptoms of Chronic Stress Syndrome. Minor stressors can be overcome by the body's inbuilt stress response but when there is unending stress for a long period of time then an external help is required to ease the stress.  Chronic Stress Syndrome can physically and psychologically drain you over a period of time. For such cases stress management is the best way to cope up with Chronic Stress Syndrome. If Chronic Stress Syndrome is not treated then it may result in many health hazards like anxiety, muscle pain, insomnia, and high blood pressure along with a compromised immune system leading to frequent infections and missed days from work.    What are the Symptoms  of Chronic Stress Syndrome?   The symptoms of Chronic Stress Syndrome are variable and range from generalized symptoms to emotional symptoms along with behavioral and cognitive symptoms. Some of these symptoms have been delineated below:  Generalized Symptoms of Chronic Stress Syndrome are: Anxiety Depression Social isolation Headache Abdominal pain Lack of sleep Back pain Difficulty in concentrating Hypertension Hemorrhoids Varicose veins Panic attacks/ Panic disorder Cardiovascular diseases.   Some of the Emotional Symptoms of Chronic Stress Syndrome are: To become  easily agitated, moody and frustrated Feeling overwhelmed which makes you feel like you are losing control. Having difficulty relaxing and have a peaceful mind Having low self esteem Feeling lonely Feeling worthless Feeling depressed Avoiding social environment.   Some of the Physical Symptoms of Chronic Stress Syndrome are: Headaches Lethargy Alternating diarrhea and constipation Nausea Muscles aches and pains Insomnia Rapid heartbeat and chest pain Infections and frequent colds Decreased libido Nervousness and shaking Tinnitus Sweaty palms Dry mouth Clenched jaw.  Some of the Cognitive Symptoms of Chronic Stress Syndrome are: Constant worrying Racing thoughts Disorganization and forgetfulness Inability to focus Poor judgment Abundance of negativity.  Some of the Behavioral Symptoms of Chronic Stress Syndrome are: Changes in appetite with less desire to eat Avoiding responsibilities Indulgence in alcohol or recreational drug use Increased nail biting and being fidgety Ways to Deal With Chronic Stress Syndrome    Chronic Stress Syndrome is not something which cannot be addressed. A bit of effort from your side in the form of lifestyle modifications, a little bit of exercise, a balanced work life equation can do wonders and help you get rid of Chronic Stress Syndrome.  Get Proper Sleep: It has been proved that Chronic Stress Syndrome causes loss of sleep where an individual may not even be able to sleep for days unending. This may result in the individual feeling lethargic and unable to focus at work the following morning. This may lead to decreased performance at work. Thus, it is important to have a good sleep-wake cycle. For this, try and not drink any caffeinated beverage about four hours prior to going to sleep, as caffeine pumps up the adrenaline and causes you to stay awake resulting ultimately in Chronic Stress Syndrome.  Avoid Alcohol and Drugs: Another way to  get rid of Chronic Stress Syndrome is lifestyle modifications. Stay away from alcohol and other recreational drugs. Take Short Frequent Breaks at Work: Try to take frequent breaks from work and do not work continuously. Try and manage your work in such a way that you even meet your deadline and come home on time for a happy dinner with family. A good time spent with family and kids does wonders in not only dealing with Chronic Stress Syndrome but also preventing it.  Become Physically Active: Another step towards getting rid of Chronic Stress Syndrome is physical activity. If you do not have time to spend at the gym then at least try and go for daily walks for about half an hour a day which not only keeps the stress away but also is good for your overall health. Physical activity leads to production of endorphins which will make you feel relaxed and feel good.  Healthy Diet Can Help You Deal With Chronic Stress Syndrome: Have a balanced and healthy diet is another step towards a stress free life and keeping Chronic Stress Syndrome at [place]. If time is a constraint then you can try eating three small meals a day. Try and avoid fast foods and take  foods which are healthy and rich in proteins, fiber, and carbohydrates to boost your energy system.  Music Can Soothe Your Mind: Light music is one of the best and most effective relaxation techniques that one can try to overcome stress. It has shown to calm down the mind and take you away from all the stressors that you may be having. These days it is also being used as a therapy in some institutes for overcoming stress. It is important here to discuss the importance of a good social support system for patients with Chronic Stress Syndrome, as a good social support framework can do wonders in taking the stress away from the patient and overcoming Chronic Stress Syndrome.  Meditation Can Help You Deal With Chronic Stress Syndrome Effectively: Meditation and yoga has  also shown to be quite effective in relaxing the mind and coping up with Chronic Stress Syndrome   In cases where these measures are not helpful, then it is time for you to consult with a skilled psychologist or a psychiatrist for potential therapies or medications to control the stress response.   The psychologist can help you with a variety of steps for coping up with Chronic Stress Syndrome. Relaxation techniques and behavioral therapy are some of the methods employed by psychologists. In some cases, medications can also be given to help relax the patient.  Since Chronic Stress Syndrome is both emotionally and physically draining for the patient and it also adversely affects the family life of the patient hence it is important for the patient to recognize the condition and taking steps to cope up with it. Escaping measures like alcohol and drug use are of no help as they only aggravate the condition apart from their other health hazards. If this condition is ignored or left untreated it can lead to various medical conditions like anxiety and depression and various other medical conditions.  Last but not least, smile as often as you can as it is the best gift that you can give to someone. The best way to stay relaxed is to have a good smile, exercise daily, spend time with your family, meditation and if required consultation with a good psychologist so that you can live a stress free life and overcome the symptoms of Chronic Stress Syndrome.

## 2017-11-07 LAB — CBC WITH DIFFERENTIAL/PLATELET
BASOS: 0 %
Basophils Absolute: 0 10*3/uL (ref 0.0–0.2)
EOS (ABSOLUTE): 0.2 10*3/uL (ref 0.0–0.4)
Eos: 3 %
Hematocrit: 39.3 % (ref 34.0–46.6)
Hemoglobin: 13 g/dL (ref 11.1–15.9)
Immature Grans (Abs): 0 10*3/uL (ref 0.0–0.1)
Immature Granulocytes: 0 %
Lymphocytes Absolute: 2.7 10*3/uL (ref 0.7–3.1)
Lymphs: 36 %
MCH: 27.9 pg (ref 26.6–33.0)
MCHC: 33.1 g/dL (ref 31.5–35.7)
MCV: 84 fL (ref 79–97)
MONOS ABS: 0.4 10*3/uL (ref 0.1–0.9)
Monocytes: 6 %
NEUTROS ABS: 4.1 10*3/uL (ref 1.4–7.0)
NEUTROS PCT: 55 %
PLATELETS: 248 10*3/uL (ref 150–450)
RBC: 4.66 x10E6/uL (ref 3.77–5.28)
RDW: 15.3 % (ref 12.3–15.4)
WBC: 7.4 10*3/uL (ref 3.4–10.8)

## 2017-11-07 LAB — VITAMIN D 25 HYDROXY (VIT D DEFICIENCY, FRACTURES): Vit D, 25-Hydroxy: 26.8 ng/mL — ABNORMAL LOW (ref 30.0–100.0)

## 2017-11-07 LAB — IRON AND TIBC
IRON: 88 ug/dL (ref 27–159)
Iron Saturation: 24 % (ref 15–55)
Total Iron Binding Capacity: 370 ug/dL (ref 250–450)
UIBC: 282 ug/dL (ref 131–425)

## 2017-12-18 ENCOUNTER — Ambulatory Visit: Payer: 59 | Admitting: Nurse Practitioner

## 2017-12-20 ENCOUNTER — Encounter: Payer: Self-pay | Admitting: Family Medicine

## 2017-12-22 ENCOUNTER — Telehealth: Payer: Self-pay | Admitting: Family Medicine

## 2017-12-22 NOTE — Telephone Encounter (Signed)
Patient returned medical assistant's call .--glh

## 2018-06-10 ENCOUNTER — Other Ambulatory Visit: Payer: Self-pay

## 2018-06-10 DIAGNOSIS — F419 Anxiety disorder, unspecified: Secondary | ICD-10-CM

## 2018-06-10 MED ORDER — VENLAFAXINE HCL ER 150 MG PO CP24: ORAL_CAPSULE | ORAL | 1 refills | Status: AC

## 2022-03-06 ENCOUNTER — Encounter: Payer: Self-pay | Admitting: Gastroenterology

## 2024-04-06 ENCOUNTER — Encounter: Payer: Self-pay | Admitting: Gastroenterology
